# Patient Record
Sex: Male | Born: 1960 | Race: Black or African American | Hispanic: No | Marital: Single | Smoking: Current every day smoker
Health system: Southern US, Community
[De-identification: ages and names within clinical notes are randomized; demographics above are authoritative.]

## PROBLEM LIST (undated history)

## (undated) DIAGNOSIS — I10 Essential (primary) hypertension: Secondary | ICD-10-CM

## (undated) HISTORY — PX: OTHER SURGICAL HISTORY: SHX169

---

## 2019-04-09 DIAGNOSIS — R0600 Dyspnea, unspecified: Secondary | ICD-10-CM

## 2019-04-09 DIAGNOSIS — R0902 Hypoxemia: Secondary | ICD-10-CM

## 2019-04-09 DIAGNOSIS — I10 Essential (primary) hypertension: Secondary | ICD-10-CM

## 2019-04-09 DIAGNOSIS — J441 Chronic obstructive pulmonary disease with (acute) exacerbation: Secondary | ICD-10-CM | POA: Diagnosis not present

## 2019-04-09 DIAGNOSIS — J209 Acute bronchitis, unspecified: Secondary | ICD-10-CM

## 2020-10-28 ENCOUNTER — Inpatient Hospital Stay (HOSPITAL_COMMUNITY)
Admission: EM | Admit: 2020-10-28 | Discharge: 2020-11-01 | DRG: 177 | Disposition: A | Discharge status: Home / Self Care | Payer: Medicaid Other | Attending: Internal Medicine | Admitting: Internal Medicine

## 2020-10-28 ENCOUNTER — Emergency Department (HOSPITAL_COMMUNITY): Payer: Medicaid Other

## 2020-10-28 DIAGNOSIS — G9340 Encephalopathy, unspecified: Secondary | ICD-10-CM

## 2020-10-28 DIAGNOSIS — T380X5A Adverse effect of glucocorticoids and synthetic analogues, initial encounter: Secondary | ICD-10-CM | POA: Diagnosis not present

## 2020-10-28 DIAGNOSIS — N179 Acute kidney failure, unspecified: Secondary | ICD-10-CM | POA: Diagnosis present

## 2020-10-28 DIAGNOSIS — J440 Chronic obstructive pulmonary disease with acute lower respiratory infection: Secondary | ICD-10-CM | POA: Diagnosis present

## 2020-10-28 DIAGNOSIS — J1282 Pneumonia due to coronavirus disease 2019: Secondary | ICD-10-CM | POA: Diagnosis present

## 2020-10-28 DIAGNOSIS — G9341 Metabolic encephalopathy: Secondary | ICD-10-CM | POA: Diagnosis present

## 2020-10-28 DIAGNOSIS — D72829 Elevated white blood cell count, unspecified: Secondary | ICD-10-CM | POA: Diagnosis not present

## 2020-10-28 DIAGNOSIS — E872 Acidosis, unspecified: Secondary | ICD-10-CM

## 2020-10-28 DIAGNOSIS — N400 Enlarged prostate without lower urinary tract symptoms: Secondary | ICD-10-CM | POA: Diagnosis present

## 2020-10-28 DIAGNOSIS — U071 COVID-19: Principal | ICD-10-CM | POA: Diagnosis present

## 2020-10-28 DIAGNOSIS — E860 Dehydration: Secondary | ICD-10-CM | POA: Diagnosis present

## 2020-10-28 DIAGNOSIS — K219 Gastro-esophageal reflux disease without esophagitis: Secondary | ICD-10-CM | POA: Diagnosis present

## 2020-10-28 DIAGNOSIS — J189 Pneumonia, unspecified organism: Secondary | ICD-10-CM | POA: Diagnosis present

## 2020-10-28 DIAGNOSIS — R7401 Elevation of levels of liver transaminase levels: Secondary | ICD-10-CM

## 2020-10-28 DIAGNOSIS — R4182 Altered mental status, unspecified: Secondary | ICD-10-CM

## 2020-10-28 DIAGNOSIS — I10 Essential (primary) hypertension: Secondary | ICD-10-CM | POA: Diagnosis present

## 2020-10-28 HISTORY — DX: Essential (primary) hypertension: I10

## 2020-10-28 MED ORDER — SODIUM CHLORIDE 0.9 % IV BOLUS
1000.0000 mL | Freq: Once | INTRAVENOUS | Status: AC
  Administered 2020-10-29: 1000 mL via INTRAVENOUS

## 2020-10-28 NOTE — ED Notes (Signed)
[phone] pt's baby mama Geanie Kenning has called twice stated that she is the one who called EMS for him, would like to speak to him or someone regarding how he is doing.

## 2020-10-28 NOTE — ED Triage Notes (Signed)
The pt arrived by ems from [employer]   That is where he receives all his medical care  His son  Who is not  With the pt  Reports approx a week of not acting right   He knows the day of the week  Not the month  Does know the year  Moves everything no complaints of pain alert co-operative

## 2020-10-29 ENCOUNTER — Other Ambulatory Visit: Payer: Self-pay

## 2020-10-29 ENCOUNTER — Emergency Department (HOSPITAL_COMMUNITY): Payer: Medicaid Other

## 2020-10-29 ENCOUNTER — Encounter (HOSPITAL_COMMUNITY): Payer: Self-pay | Admitting: *Deleted

## 2020-10-29 DIAGNOSIS — E872 Acidosis, unspecified: Secondary | ICD-10-CM

## 2020-10-29 DIAGNOSIS — N179 Acute kidney failure, unspecified: Secondary | ICD-10-CM

## 2020-10-29 DIAGNOSIS — I10 Essential (primary) hypertension: Secondary | ICD-10-CM

## 2020-10-29 DIAGNOSIS — J189 Pneumonia, unspecified organism: Secondary | ICD-10-CM | POA: Diagnosis not present

## 2020-10-29 DIAGNOSIS — G9340 Encephalopathy, unspecified: Secondary | ICD-10-CM

## 2020-10-29 LAB — URINALYSIS, ROUTINE W REFLEX MICROSCOPIC
Bacteria, UA: NONE SEEN
Bilirubin Urine: NEGATIVE
Glucose, UA: NEGATIVE mg/dL
Ketones, ur: NEGATIVE mg/dL
Leukocytes,Ua: NEGATIVE
Nitrite: NEGATIVE
Protein, ur: 30 mg/dL — AB
Specific Gravity, Urine: 1.014 (ref 1.005–1.030)
pH: 5 (ref 5.0–8.0)

## 2020-10-29 LAB — CBC WITH DIFFERENTIAL/PLATELET
Abs Immature Granulocytes: 0.04 10*3/uL (ref 0.00–0.07)
Basophils Absolute: 0 10*3/uL (ref 0.0–0.1)
Basophils Relative: 0 %
Eosinophils Absolute: 0.1 10*3/uL (ref 0.0–0.5)
Eosinophils Relative: 2 %
HCT: 49.5 % (ref 39.0–52.0)
Hemoglobin: 16.2 g/dL (ref 13.0–17.0)
Immature Granulocytes: 1 %
Lymphocytes Relative: 12 %
Lymphs Abs: 0.7 10*3/uL (ref 0.7–4.0)
MCH: 26.9 pg (ref 26.0–34.0)
MCHC: 32.7 g/dL (ref 30.0–36.0)
MCV: 82.1 fL (ref 80.0–100.0)
Monocytes Absolute: 0.6 10*3/uL (ref 0.1–1.0)
Monocytes Relative: 10 %
Neutro Abs: 4.8 10*3/uL (ref 1.7–7.7)
Neutrophils Relative %: 75 %
Platelets: 366 10*3/uL (ref 150–400)
RBC: 6.03 MIL/uL — ABNORMAL HIGH (ref 4.22–5.81)
RDW: 11.9 % (ref 11.5–15.5)
WBC: 6.3 10*3/uL (ref 4.0–10.5)
nRBC: 0 % (ref 0.0–0.2)

## 2020-10-29 LAB — PROCALCITONIN: Procalcitonin: 0.19 ng/mL

## 2020-10-29 LAB — COMPREHENSIVE METABOLIC PANEL
ALT: 66 U/L — ABNORMAL HIGH (ref 0–44)
AST: 55 U/L — ABNORMAL HIGH (ref 15–41)
Albumin: 2.8 g/dL — ABNORMAL LOW (ref 3.5–5.0)
Alkaline Phosphatase: 64 U/L (ref 38–126)
Anion gap: 16 — ABNORMAL HIGH (ref 5–15)
BUN: 37 mg/dL — ABNORMAL HIGH (ref 8–23)
CO2: 19 mmol/L — ABNORMAL LOW (ref 22–32)
Calcium: 8.4 mg/dL — ABNORMAL LOW (ref 8.9–10.3)
Chloride: 102 mmol/L (ref 98–111)
Creatinine, Ser: 1.49 mg/dL — ABNORMAL HIGH (ref 0.61–1.24)
GFR, Estimated: 53 mL/min — ABNORMAL LOW (ref >60)
Glucose, Bld: 135 mg/dL — ABNORMAL HIGH (ref 70–99)
Potassium: 4.4 mmol/L (ref 3.5–5.1)
Sodium: 137 mmol/L (ref 135–145)
Total Bilirubin: 1.4 mg/dL — ABNORMAL HIGH (ref 0.3–1.2)
Total Protein: 7.3 g/dL (ref 6.5–8.1)

## 2020-10-29 LAB — HIV ANTIBODY (ROUTINE TESTING W REFLEX): HIV Screen 4th Generation wRfx: NONREACTIVE

## 2020-10-29 LAB — VITAMIN B12: Vitamin B-12: 785 pg/mL (ref 180–914)

## 2020-10-29 LAB — BASIC METABOLIC PANEL
Anion gap: 12 (ref 5–15)
BUN: 30 mg/dL — ABNORMAL HIGH (ref 8–23)
CO2: 22 mmol/L (ref 22–32)
Calcium: 8.2 mg/dL — ABNORMAL LOW (ref 8.9–10.3)
Chloride: 105 mmol/L (ref 98–111)
Creatinine, Ser: 1.21 mg/dL (ref 0.61–1.24)
GFR, Estimated: >60 mL/min (ref >60)
Glucose, Bld: 119 mg/dL — ABNORMAL HIGH (ref 70–99)
Potassium: 4.5 mmol/L (ref 3.5–5.1)
Sodium: 139 mmol/L (ref 135–145)

## 2020-10-29 LAB — AMMONIA: Ammonia: 34 umol/L (ref 9–35)

## 2020-10-29 LAB — SARS CORONAVIRUS 2 (TAT 6-24 HRS): SARS Coronavirus 2: POSITIVE — AB

## 2020-10-29 LAB — RESP PANEL BY RT-PCR (FLU A&B, COVID) ARPGX2
Influenza A by PCR: NEGATIVE
Influenza B by PCR: NEGATIVE
SARS Coronavirus 2 by RT PCR: NEGATIVE

## 2020-10-29 LAB — TSH: TSH: 0.989 u[IU]/mL (ref 0.350–4.500)

## 2020-10-29 LAB — LACTIC ACID, PLASMA: Lactic Acid, Venous: 1.3 mmol/L (ref 0.5–1.9)

## 2020-10-29 MED ORDER — HEPARIN SODIUM (PORCINE) 5000 UNIT/ML IJ SOLN
5000.0000 [IU] | Freq: Three times a day (TID) | INTRAMUSCULAR | Status: DC
  Administered 2020-10-29 (×2): 5000 [IU] via SUBCUTANEOUS
  Filled 2020-10-29 (×2): qty 1

## 2020-10-29 MED ORDER — METHYLPREDNISOLONE SODIUM SUCC 125 MG IJ SOLR
0.5000 mg/kg | Freq: Two times a day (BID) | INTRAMUSCULAR | Status: AC
  Administered 2020-10-29 – 2020-11-01 (×6): 41.875 mg via INTRAVENOUS
  Filled 2020-10-29 (×6): qty 2

## 2020-10-29 MED ORDER — GUAIFENESIN-DM 100-10 MG/5ML PO SYRP
10.0000 mL | ORAL_SOLUTION | ORAL | Status: DC | PRN
  Filled 2020-10-29: qty 10

## 2020-10-29 MED ORDER — SODIUM CHLORIDE 0.9 % IV SOLN: INTRAVENOUS | Status: DC

## 2020-10-29 MED ORDER — ZINC SULFATE 220 (50 ZN) MG PO CAPS
220.0000 mg | ORAL_CAPSULE | Freq: Every day | ORAL | Status: DC
  Administered 2020-10-29 – 2020-11-01 (×4): 220 mg via ORAL
  Filled 2020-10-29 (×4): qty 1

## 2020-10-29 MED ORDER — SODIUM CHLORIDE 0.9 % IV SOLN
200.0000 mg | Freq: Once | INTRAVENOUS | Status: AC
  Administered 2020-10-29: 200 mg via INTRAVENOUS
  Filled 2020-10-29: qty 40

## 2020-10-29 MED ORDER — ASCORBIC ACID 500 MG PO TABS
500.0000 mg | ORAL_TABLET | Freq: Every day | ORAL | Status: DC
  Administered 2020-10-29 – 2020-11-01 (×4): 500 mg via ORAL
  Filled 2020-10-29 (×4): qty 1

## 2020-10-29 MED ORDER — AZELASTINE HCL 0.1 % NA SOLN: 1.0000 | Freq: Every day | NASAL | Status: DC | PRN

## 2020-10-29 MED ORDER — SODIUM CHLORIDE 0.9 % IV SOLN
1.0000 g | INTRAVENOUS | Status: DC
  Filled 2020-10-29: qty 10

## 2020-10-29 MED ORDER — HEPARIN SODIUM (PORCINE) 5000 UNIT/ML IJ SOLN
5000.0000 [IU] | Freq: Three times a day (TID) | INTRAMUSCULAR | Status: DC
  Administered 2020-10-29 – 2020-10-30 (×2): 5000 [IU] via SUBCUTANEOUS
  Filled 2020-10-29 (×2): qty 1

## 2020-10-29 MED ORDER — SODIUM CHLORIDE 0.9 % IV SOLN
500.0000 mg | Freq: Once | INTRAVENOUS | Status: AC
  Administered 2020-10-29: 500 mg via INTRAVENOUS
  Filled 2020-10-29: qty 500

## 2020-10-29 MED ORDER — ALBUTEROL SULFATE HFA 108 (90 BASE) MCG/ACT IN AERS
2.0000 | INHALATION_SPRAY | Freq: Four times a day (QID) | RESPIRATORY_TRACT | Status: DC
  Administered 2020-10-29 – 2020-10-30 (×5): 2 via RESPIRATORY_TRACT
  Filled 2020-10-29: qty 6.7

## 2020-10-29 MED ORDER — ACETAMINOPHEN 650 MG RE SUPP: 650.0000 mg | Freq: Four times a day (QID) | RECTAL | Status: DC | PRN

## 2020-10-29 MED ORDER — BUDESON-GLYCOPYRROL-FORMOTEROL 160-9-4.8 MCG/ACT IN AERO: 2.0000 | INHALATION_SPRAY | RESPIRATORY_TRACT | Status: DC

## 2020-10-29 MED ORDER — MOMETASONE FURO-FORMOTEROL FUM 200-5 MCG/ACT IN AERO
2.0000 | INHALATION_SPRAY | Freq: Two times a day (BID) | RESPIRATORY_TRACT | Status: DC
  Administered 2020-10-29 – 2020-11-01 (×7): 2 via RESPIRATORY_TRACT
  Filled 2020-10-29: qty 8.8

## 2020-10-29 MED ORDER — UMECLIDINIUM BROMIDE 62.5 MCG/INH IN AEPB
1.0000 | INHALATION_SPRAY | Freq: Every day | RESPIRATORY_TRACT | Status: DC
  Administered 2020-10-29 – 2020-11-01 (×4): 1 via RESPIRATORY_TRACT
  Filled 2020-10-29: qty 7

## 2020-10-29 MED ORDER — SODIUM CHLORIDE 0.9 % IV SOLN
500.0000 mg | INTRAVENOUS | Status: DC
  Filled 2020-10-29: qty 500

## 2020-10-29 MED ORDER — PREDNISONE 20 MG PO TABS
50.0000 mg | ORAL_TABLET | Freq: Every day | ORAL | Status: DC
  Administered 2020-11-01: 50 mg via ORAL
  Filled 2020-10-29: qty 2

## 2020-10-29 MED ORDER — SODIUM CHLORIDE 0.9 % IV SOLN
100.0000 mg | Freq: Every day | INTRAVENOUS | Status: DC
  Administered 2020-10-30 – 2020-11-01 (×3): 100 mg via INTRAVENOUS
  Filled 2020-10-29 (×3): qty 20

## 2020-10-29 MED ORDER — ENSURE ENLIVE PO LIQD
237.0000 mL | Freq: Two times a day (BID) | ORAL | Status: DC
  Administered 2020-10-29 – 2020-10-31 (×5): 237 mL via ORAL
  Filled 2020-10-29 (×2): qty 237

## 2020-10-29 MED ORDER — MONTELUKAST SODIUM 10 MG PO TABS
10.0000 mg | ORAL_TABLET | Freq: Every day | ORAL | Status: DC
Start: 2020-10-29 — End: 2020-11-01
  Administered 2020-10-29 – 2020-11-01 (×4): 10 mg via ORAL
  Filled 2020-10-29 (×5): qty 1

## 2020-10-29 MED ORDER — TAMSULOSIN HCL 0.4 MG PO CAPS
0.4000 mg | ORAL_CAPSULE | Freq: Every day | ORAL | Status: DC
Start: 2020-10-29 — End: 2020-11-01
  Administered 2020-10-29 – 2020-10-31 (×3): 0.4 mg via ORAL
  Filled 2020-10-29 (×3): qty 1

## 2020-10-29 MED ORDER — PANTOPRAZOLE SODIUM 40 MG PO TBEC
40.0000 mg | DELAYED_RELEASE_TABLET | Freq: Every day | ORAL | Status: DC
  Administered 2020-10-29 – 2020-11-01 (×4): 40 mg via ORAL
  Filled 2020-10-29 (×4): qty 1

## 2020-10-29 MED ORDER — ACETAMINOPHEN 325 MG PO TABS: 650.0000 mg | ORAL_TABLET | Freq: Four times a day (QID) | ORAL | Status: DC | PRN

## 2020-10-29 MED ORDER — SODIUM CHLORIDE 0.9 % IV SOLN
1.0000 g | Freq: Once | INTRAVENOUS | Status: AC
  Administered 2020-10-29: 1 g via INTRAVENOUS
  Filled 2020-10-29: qty 10

## 2020-10-29 NOTE — ED Triage Notes (Signed)
The pt has not had the covid vaccines

## 2020-10-29 NOTE — ED Notes (Signed)
The admitting doctor wants the pt to be reswabbed because she suspects that the pt is actually positive

## 2020-10-29 NOTE — ED Provider Notes (Signed)
[facility] EMERGENCY DEPARTMENT Provider Note   CSN: [id number] Arrival date & time: 10/28/20  2337     History Chief Complaint  Patient presents with  . Altered Mental Status    Cory Leach. is a 60 y.o. male.  The history is provided by the patient and medical records.  Altered Mental Status Presenting symptoms: confusion   Associated symptoms: weakness    60 y.o. M with hx of HTN, presenting to the ED for reported confusion.  Per son, patient has not been acting right for the past week.  Reported to EMS that he seems "off" when talking to him.  Patient himself admits he has not been feeling well, states he feels "foggy" in his head.  He denies any frank confusion.  States he feels weak all over, does not lateralize.  He has not had any falls or syncopal events.  No reported fevers, states he has had some chills.  Denies chest pain or SOB, has had a bit of a cough.  Limited PO intake, states he feels dehydrated.  Past Medical History:  Diagnosis Date  . Hypertension     There are no problems to display for this patient.     No family history on file.     Home Medications Prior to Admission medications   Not on File    Allergies    Patient has no known allergies.  Review of Systems   Review of Systems  Respiratory: Positive for cough.   Neurological: Positive for weakness.  Psychiatric/Behavioral: Positive for confusion.  All other systems reviewed and are negative.   Physical Exam Updated Vital Signs There were no vitals taken for this visit.  Physical Exam Vitals and nursing note reviewed.  Constitutional:      Appearance: He is well-developed and well-nourished.  HENT:     Head: Normocephalic and atraumatic.     Mouth/Throat:     Mouth: Oropharynx is clear and moist.  Eyes:     Extraocular Movements: EOM normal.     Conjunctiva/sclera: Conjunctivae normal.     Pupils: Pupils are equal, round, and reactive to  light.  Cardiovascular:     Rate and Rhythm: Normal rate and regular rhythm.     Heart sounds: Normal heart sounds.  Pulmonary:     Effort: Pulmonary effort is normal.     Breath sounds: Normal breath sounds.     Comments: Mild cough noted on exam, NAD, O2 sats mid 90's on RA Abdominal:     General: Bowel sounds are normal.     Palpations: Abdomen is soft.  Musculoskeletal:        General: Normal range of motion.     Cervical back: Normal range of motion.  Skin:    General: Skin is warm and dry.  Neurological:     Mental Status: He is alert.     Comments: Awake, alert, oriented to self and situation, confusion regarding month/year, able to follow commands but does seem to have some periodic confusion when answering questions, moving arms and legs well  Psychiatric:        Mood and Affect: Mood and affect normal.     ED Results / Procedures / Treatments   Labs (all labs ordered are listed, but only abnormal results are displayed) Labs Reviewed  CBC WITH DIFFERENTIAL/PLATELET - Abnormal; Notable for the following components:      Result Value   RBC 6.03 (*)    All other components  within normal limits  COMPREHENSIVE METABOLIC PANEL - Abnormal; Notable for the following components:   CO2 19 (*)    Glucose, Bld 135 (*)    BUN 37 (*)    Creatinine, Ser 1.49 (*)    Calcium 8.4 (*)    Albumin 2.8 (*)    AST 55 (*)    ALT 66 (*)    Total Bilirubin 1.4 (*)    GFR, Estimated 53 (*)    Anion gap 16 (*)    All other components within normal limits  RESP PANEL BY RT-PCR (FLU A&B, COVID) [id number]  URINALYSIS, ROUTINE W REFLEX MICROSCOPIC    EKG None  Radiology DG Chest Port 1 View  Result Date: 10/29/2020 CLINICAL DATA:  Change in mental status EXAM: PORTABLE CHEST 1 VIEW COMPARISON:  April 18, 2019 FINDINGS: The heart size and mediastinal contours are within normal limits. Multifocal patchy airspace opacities seen predominantly within the periphery of the lower lungs. No pleural  effusion is seen. The visualized skeletal structures are unremarkable. IMPRESSION: Multifocal airspace opacities throughout both lungs predominantly within the lower lungs likely consistent multifocal pneumonia Electronically Signed   By: Jonna Clark M.D.   On: 10/29/2020 00:04    Procedures Procedures (including critical care time)  CRITICAL CARE Performed by: Garlon Hatchet   Total critical care time: 40 minutes  Critical care time was exclusive of separately billable procedures and treating other patients.  Critical care was necessary to treat or prevent imminent or life-threatening deterioration.  Critical care was time spent personally by me on the following activities: development of treatment plan with patient and/or surrogate as well as nursing, discussions with consultants, evaluation of patient's response to treatment, examination of patient, obtaining history from patient or surrogate, ordering and performing treatments and interventions, ordering and review of laboratory studies, ordering and review of radiographic studies, pulse oximetry and re-evaluation of patient's condition.   Medications Ordered in ED Medications  cefTRIAXone (ROCEPHIN) 1 g in sodium chloride 0.9 % 100 mL IVPB (has no administration in time range)  azithromycin (ZITHROMAX) 500 mg in sodium chloride 0.9 % 250 mL IVPB (has no administration in time range)  sodium chloride 0.9 % bolus 1,000 mL (1,000 mLs Intravenous New Bag/Given 10/29/20 0053)    ED Course  I have reviewed the triage vital signs and the nursing notes.  Pertinent labs & imaging results that were available during my care of the patient were reviewed by me and considered in my medical decision making (see chart for details).    MDM Rules/Calculators/A&P    59 year old male here with altered mental status.  Per son has been somewhat "off" for the past week.  Patient himself admits he feels "foggy".  He denies any falls or syncopal  events.  He is awake and alert here, able to answer most questions and follow commands, does seem intermittently confused and having to think quite hard about his answers.  He is unable to give correct month or year.  He is aware of current situation.  He is moving his arms and legs well, no apparent focal deficit.  Will obtain labs, chest x-ray, CT head, COVID screen, urinalysis.  He does appear somewhat clinically dry, will give liter of fluids.  2:11 AM Patient reassessed--- he is holding urinal and seems very confused about what he is trying to do.  He then placed it to his mouth as if to drink from it.  He does seem a bit more confused now  than time of arrival.  Labs reassuring aside from mild AKI and signs of dehydration, covid negative.  Has been given IVF already.  CXR with multifocal pneumonia.  CT head negative.  O2 sats have been low/normal range in low 90's.  No acute signs of distress currently.  Given his apparent confusion, will admit for ongoing care.  Attempted to call son for update x2 but no answer at number left by EMS.  IV abx started.  Discussed with hospitalist, Dr. Loney Loh-- will admit for ongoing care.  Final Clinical Impression(s) / ED Diagnoses Final diagnoses:  Multifocal pneumonia  Altered mental status, unspecified altered mental status type    Rx / DC Orders ED Discharge Orders    None       Garlon Hatchet, PA-C 10/29/20 0249    Marily Memos, MD 10/29/20 [phone]

## 2020-10-29 NOTE — Progress Notes (Signed)
Initial Nutrition Assessment  DOCUMENTATION CODES:   Not applicable  INTERVENTION:  Provide Ensure Enlive po BID, each supplement provides 350 kcal and 20 grams of protein.  Encourage adequate PO intake.   NUTRITION DIAGNOSIS:   Increased nutrient needs related to catabolic illness (COVID) as evidenced by estimated needs.  GOAL:   Patient will meet greater than or equal to 90% of their needs  MONITOR:   PO intake,Supplement acceptance,Skin,Weight trends,Labs,I & O's  REASON FOR ASSESSMENT:   Malnutrition Screening Tool    ASSESSMENT:   60 y.o. male with medical history significant of hypertension, BPH, GERD, COPD presents with altered mental status. Pt COVID positive. Chest x-ray showing multifocal airspace opacities concerning for pneumonia  Pt with acute metabolic encephalopathy, confused. RD unable to obtain pt nutrition history at this time. RD to order nutritional supplements to aid in caloric and protein needs. Unable to complete Nutrition-Focused physical exam at this time. Labs and medications reviewed.   Labs and medications reviewed.   Diet Order:   Diet Order            Diet Heart Room service appropriate? Yes; Fluid consistency: Thin  Diet effective now                 EDUCATION NEEDS:   Not appropriate for education at this time  Skin:  Skin Assessment: Reviewed RN Assessment  Last BM:  1/18  Height:   Ht Readings from Last 1 Encounters:  10/29/20 5\' 10"  (1.778 m)    Weight:   Wt Readings from Last 1 Encounters:  10/29/20 83.6 kg   BMI:  Body mass index is 26.44 kg/m.  Estimated Nutritional Needs:   Kcal:  2100-2300  Protein:  105-115 grams  Fluid:  >/= 2 L/day  10/31/20, MS, RD, LDN RD pager number/after hours weekend pager number on Amion.

## 2020-10-29 NOTE — ED Notes (Addendum)
Patients significant other, Geanie Kenning called to get an update on the patient if possible. [phone]. Patient has called multiple times to speak with the patient.

## 2020-10-29 NOTE — Progress Notes (Signed)
Patient arrived to room 5C15 in NAD, VS stable and patient free from pain. Patient oriented to room and call bell in reach.

## 2020-10-29 NOTE — ED Notes (Signed)
Breakfast ordered 

## 2020-10-29 NOTE — ED Notes (Signed)
Admitting doctor saw

## 2020-10-29 NOTE — ED Notes (Signed)
Report given to the rn on 5c

## 2020-10-29 NOTE — ED Notes (Signed)
The pt is getting multiple calls from outside and the pt appears to be angry  With at least one of them  hes asking for his cell phone

## 2020-10-29 NOTE — Progress Notes (Addendum)
Same day note  Patient seen and examined at bedside.  Patient was admitted to the hospital for altered mental status  At the time of my evaluation, patient complains of mild shortness of breath, feels confused.  Denies any chest pain, sputum production.  No nausea vomiting  Physical examination reveals male slightly confused.  Decreased breath sounds bilaterally  Laboratory data and imaging was reviewed  Assessment and Plan.  Multifocal pneumonia: Chest x-ray with multifocal pneumonia.  COVID test and flu was negative.  No signs of sepsis.  IV Rocephin and Zithromax.  Lactate was within normal limits.  UA was negative.  Procalcitonin pending.  Possible AKI: BUN 37, creatinine 1.4.    No previous labs for comparison.  Possible prerenal in nature.  Diuretic on hold including ACE inhibitor.  Continue IV hydration for now.  Check hydration status in a.m  Acute metabolic encephalopathy: Likely due to pneumonia and AKI/dehydration.  Head CT negative.  Nonfocal exam.  Confused at times.  Unimproved consider MRI of the brain. brain MRI for further evaluation.  Check B12, TSH.  Ammonia within normal limits.  Mild high anion gap metabolic acidosis: Bicarb 19, anion gap 16.  Likely due to AKI. Continue IV hydration.  Check BMP in a.m.   Hypertension:  Blood pressure seems to be stable.  Hold diuretic and lisinopril at this time  BPH  -Continue Flomax  GERD -Continue PPI  COPD:   continue continue inhaler  No Charge  Signed,  Tenny Craw, MD [facility]  Addendum:  10/29/2020 2:45 PM  Rapid covid was negative but PCR positive. With hypoxia, currently on 2 Litres of oxygen and multifocal pneumonia, will start on remdesivir, iv solumedrol.

## 2020-10-29 NOTE — H&P (Addendum)
History and Physical    Renton Berkley OYO:[id number] DOB: [birth year] DOA: 10/28/2020  PCP: Patient, No Pcp Per Patient coming from: Home  Chief Complaint: Altered mental status  HPI: Cory Leach. is a 60 y.o. male with medical history significant of hypertension, BPH, GERD, COPD presenting to the ED via EMS for evaluation of altered mental status.  Family reported confusion and disorientation for the past 1 week.  Patient appears confused.  Reports dry mouth and a poor appetite for the past 1 week.  He has no other complaints.  Denies fevers, chills, body aches, fatigue, cough, shortness of breath, chest pain, nausea, vomiting, abdominal pain, or diarrhea.  He is not vaccinated against COVID.  ED Course: No temperature recorded.  Tachypneic.  Not significantly tachycardic or hypotensive.  WBC 6.3, hemoglobin 16.2, platelet count 366K.  Sodium 137, potassium 4.4, chloride 102, bicarb 19, anion gap 16, BUN 37, creatinine 1.4, glucose 135.  AST 55, ALT 66, alk phos 64, T bili 1.4.  SARS-CoV-2 PCR test and influenza panel both negative.  UA not suggestive of infection.  Chest x-ray showing multifocal airspace opacities concerning for pneumonia.  Head CT negative for acute intracranial abnormality. Patient was given ceftriaxone, azithromycin, and 1 L normal saline bolus.  Review of Systems:  All systems reviewed and apart from history of presenting illness, are negative.  Past Medical History:  Diagnosis Date  . Hypertension    Surgical history: Unable to obtain from the patient at this time given his altered mental status.  Social history: Unable to obtain from the patient at this time given his altered mental status.  Family history: Unable to obtain from the patient at this time given his altered mental status.  No Known Allergies  Prior to Admission medications   Medication Sig Start Date End Date Taking? Authorizing Provider  aspirin 81 MG chewable tablet Chew 81  mg by mouth daily.   Yes [provider]  azelastine (ASTELIN) 0.1 % nasal spray Place 1 spray into both nostrils daily as needed for allergies. 10/05/20  Yes [provider]  BEPREVE 1.5 % SOLN Place 1 drop into both eyes 2 (two) times daily. 09/02/20  Yes [provider]  BREZTRI AEROSPHERE 160-9-4.8 MCG/ACT AERO Inhale 2 puffs into the lungs in the morning and at bedtime. 10/07/20  Yes [provider]  famotidine (PEPCID) 40 MG tablet Take 40 mg by mouth daily. 10/05/20  Yes [provider]  hydrochlorothiazide (HYDRODIURIL) 25 MG tablet Take 25 mg by mouth daily. 10/07/20  Yes [provider]  hydrOXYzine (ATARAX/VISTARIL) 25 MG tablet Take 75 mg by mouth at bedtime. 10/12/20  Yes [provider]  lisinopril (ZESTRIL) 5 MG tablet Take 5 mg by mouth daily. 10/07/20  Yes [provider]  montelukast (SINGULAIR) 10 MG tablet Take 10 mg by mouth daily. 10/05/20  Yes [provider]  Olopatadine HCl 0.2 % SOLN Place 1 drop into both eyes daily as needed. 10/30/17  Yes [provider]  omeprazole (PRILOSEC) 40 MG capsule Take 40 mg by mouth 2 (two) times daily. 10/05/20  Yes [provider]  tamsulosin (FLOMAX) 0.4 MG CAPS capsule Take 0.4 mg by mouth at bedtime. 10/07/20  Yes [provider]    Physical Exam: Vitals:   10/29/20 0415 10/29/20 0430 10/29/20 0530 10/29/20 0536  BP: 131/87 (!) 126/107 (!) 119/91   Pulse: 87 86 87   Resp: 18 (!) 21 (!) 31   Temp:  98.6 F (37 C)  SpO2: 92% 92% 92%     Physical Exam Constitutional:      General: He is not in acute distress. HENT:     Head: Normocephalic and atraumatic.     Mouth/Throat:     Mouth: Mucous membranes are dry.  Eyes:     Extraocular Movements: Extraocular movements intact.     Conjunctiva/sclera: Conjunctivae normal.  Cardiovascular:     Rate and Rhythm: Normal rate and regular rhythm.     Pulses: Normal pulses.   Pulmonary:     Effort: Pulmonary effort is normal. No respiratory distress.     Breath sounds: No wheezing.  Abdominal:     General: Bowel sounds are normal. There is no distension.     Palpations: Abdomen is soft.     Tenderness: There is no abdominal tenderness.  Musculoskeletal:        General: No swelling or tenderness.     Cervical back: Normal range of motion and neck supple.  Skin:    General: Skin is warm and dry.  Neurological:     General: No focal deficit present.     Mental Status: He is alert and oriented to person, place, and time.     Cranial Nerves: No cranial nerve deficit.     Sensory: No sensory deficit.     Motor: No weakness.     Comments: Appears confused     Labs on Admission: I have personally reviewed following labs and imaging studies  CBC: Recent Labs  Lab 10/28/20 2349  WBC 6.3  NEUTROABS 4.8  HGB 16.2  HCT 49.5  MCV 82.1  PLT 638   Basic Metabolic Panel: Recent Labs  Lab 10/28/20 2349  NA 137  K 4.4  CL 102  CO2 19*  GLUCOSE 135*  BUN 37*  CREATININE 1.49*  CALCIUM 8.4*   GFR: CrCl cannot be calculated (Unknown ideal weight.). Liver Function Tests: Recent Labs  Lab 10/28/20 2349  AST 55*  ALT 66*  ALKPHOS 64  BILITOT 1.4*  PROT 7.3  ALBUMIN 2.8*   No results for input(s): LIPASE, AMYLASE in the last 168 hours. No results for input(s): AMMONIA in the last 168 hours. Coagulation Profile: No results for input(s): INR, PROTIME in the last 168 hours. Cardiac Enzymes: No results for input(s): CKTOTAL, CKMB, CKMBINDEX, TROPONINI in the last 168 hours. BNP (last 3 results) No results for input(s): PROBNP in the last 8760 hours. HbA1C: No results for input(s): HGBA1C in the last 72 hours. CBG: No results for input(s): GLUCAP in the last 168 hours. Lipid Profile: No results for input(s): CHOL, HDL, LDLCALC, TRIG, CHOLHDL, LDLDIRECT in the last 72 hours. Thyroid Function Tests: No results for input(s): TSH, T4TOTAL,  FREET4, T3FREE, THYROIDAB in the last 72 hours. Anemia Panel: No results for input(s): VITAMINB12, FOLATE, FERRITIN, TIBC, IRON, RETICCTPCT in the last 72 hours. Urine analysis:    Component Value Date/Time   COLORURINE YELLOW 10/29/2020 0238   APPEARANCEUR CLEAR 10/29/2020 0238   LABSPEC 1.014 10/29/2020 0238   PHURINE 5.0 10/29/2020 0238   GLUCOSEU NEGATIVE 10/29/2020 0238   HGBUR SMALL (A) 10/29/2020 0238   BILIRUBINUR NEGATIVE 10/29/2020 0238   KETONESUR NEGATIVE 10/29/2020 0238   PROTEINUR 30 (A) 10/29/2020 0238   NITRITE NEGATIVE 10/29/2020 0238   LEUKOCYTESUR NEGATIVE 10/29/2020 0238    Radiological Exams on Admission: CT Head Wo Contrast  Result Date: 10/29/2020 CLINICAL DATA:  Delirium, history of hypertension EXAM: CT HEAD WITHOUT CONTRAST TECHNIQUE:  Contiguous axial images were obtained from the base of the skull through the vertex without intravenous contrast. COMPARISON:  None. FINDINGS: Brain: No evidence of acute infarction, hemorrhage, hydrocephalus, extra-axial collection, visible mass lesion or mass effect. Symmetric prominence of the ventricles, cisterns and sulci compatible with parenchymal volume loss. Patchy areas of white matter hypoattenuation are most compatible with chronic microvascular angiopathy. Vascular: Atherosclerotic calcification of the level of the skull base. No concerning hyperdense vessel. Skull: No significant scalp swelling or hematoma. No calvarial fracture conspicuous osseous lesions. Likely remote deformity of the right nasal bone. Sinuses/Orbits: Minimal thickening in the ethmoids, otherwise the paranasal sinuses and mastoid air cells are predominantly clear. Middle ear cavities are clear. Included orbital structures are unremarkable. Other: None IMPRESSION: 1. No acute intracranial findings. 2. Mild parenchymal volume loss and chronic microvascular angiopathy. Electronically Signed   By: Lovena Le M.D.   On: 10/29/2020 01:44   DG Chest Port 1  View  Result Date: 10/29/2020 CLINICAL DATA:  Change in mental status EXAM: PORTABLE CHEST 1 VIEW COMPARISON:  April 18, 2019 FINDINGS: The heart size and mediastinal contours are within normal limits. Multifocal patchy airspace opacities seen predominantly within the periphery of the lower lungs. No pleural effusion is seen. The visualized skeletal structures are unremarkable. IMPRESSION: Multifocal airspace opacities throughout both lungs predominantly within the lower lungs likely consistent multifocal pneumonia Electronically Signed   By: Prudencio Pair M.D.   On: 10/29/2020 00:04    EKG: Independently reviewed.  Sinus rhythm, RBBB, LAFB.  No prior tracing for comparison.  Assessment/Plan Principal Problem:   Pneumonia Active Problems:   AKI (acute kidney injury) ([place])   Encephalopathy   Metabolic acidosis   HTN (hypertension)   Multifocal pneumonia: Chest x-ray showing multifocal airspace opacities predominantly in the lower lungs.  SARS-CoV-2 PCR test and influenza panel both negative.  No leukocytosis on labs.  No signs of sepsis at this time.  Not hypoxic at rest. -Continue ceftriaxone and azithromycin.  Check lactic acid level.  Check procalcitonin level.  No blood cultures drawn in the ED prior to initiation of antibiotics, will order now.  Since the patient is not vaccinated against COVID, will continue isolation precautions at this time and repeat SARS-CoV-2 PCR test.  Possible AKI: BUN 37, creatinine 1.4.  No prior labs for comparison.  Possibly prerenal due to dehydration and home thiazide diuretic/ACE inhibitor use. -Patient received fluid bolus in the ED.  Continue IV fluid hydration.  Monitor renal function and urine output.  Avoid nephrotoxic agents.  Hold home hydrochlorothiazide and lisinopril.  Acute metabolic encephalopathy: Likely due to pneumonia and AKI/dehydration.  Head CT negative for acute intracranial abnormality.  Neuro exam is nonfocal. No meningeal signs.  Patient  is currently AAO x3 but appears confused. -If no improvement with treatment plan mentioned above, will need brain MRI for further evaluation.  Check B12, TSH, and ammonia levels.  Mild high anion gap metabolic acidosis: Bicarb 19, anion gap 16.  Likely due to AKI. -Check lactic acid level.  IV fluid hydration, repeat metabolic panel.  Hypertension: Stable. -Hold home diuretic and ARB given AKI  BPH -Continue Flomax  GERD -Continue PPI  COPD: Stable.  No signs of acute exacerbation at this time. -Continue home inhaler  DVT prophylaxis: Subcutaneous heparin Code Status: Full code Family Communication: No family available at this time. Disposition Plan: Status is: Observation  The patient remains OBS appropriate and will d/c before 2 midnights.  Dispo: The patient is from: Home  Anticipated d/c is to: Home              Anticipated d/c date is: 2 days              Patient currently is not medically stable to d/c.  The medical decision making on this patient was of high complexity and the patient is at high risk for clinical deterioration, therefore this is a level 3 visit.  Shela Leff MD [facility]  If 7PM-7AM, please contact night-coverage [email or web address]  10/29/2020, 7:02 AM

## 2020-10-30 ENCOUNTER — Observation Stay (HOSPITAL_COMMUNITY): Payer: Medicaid Other

## 2020-10-30 DIAGNOSIS — N179 Acute kidney failure, unspecified: Secondary | ICD-10-CM | POA: Diagnosis present

## 2020-10-30 DIAGNOSIS — G9340 Encephalopathy, unspecified: Secondary | ICD-10-CM | POA: Diagnosis not present

## 2020-10-30 DIAGNOSIS — K219 Gastro-esophageal reflux disease without esophagitis: Secondary | ICD-10-CM | POA: Diagnosis present

## 2020-10-30 DIAGNOSIS — E860 Dehydration: Secondary | ICD-10-CM | POA: Diagnosis present

## 2020-10-30 DIAGNOSIS — U071 COVID-19: Secondary | ICD-10-CM | POA: Diagnosis present

## 2020-10-30 DIAGNOSIS — J189 Pneumonia, unspecified organism: Secondary | ICD-10-CM | POA: Diagnosis present

## 2020-10-30 DIAGNOSIS — I10 Essential (primary) hypertension: Secondary | ICD-10-CM | POA: Diagnosis present

## 2020-10-30 DIAGNOSIS — T380X5A Adverse effect of glucocorticoids and synthetic analogues, initial encounter: Secondary | ICD-10-CM | POA: Diagnosis not present

## 2020-10-30 DIAGNOSIS — J1282 Pneumonia due to coronavirus disease 2019: Secondary | ICD-10-CM | POA: Diagnosis present

## 2020-10-30 DIAGNOSIS — E872 Acidosis: Secondary | ICD-10-CM | POA: Diagnosis present

## 2020-10-30 DIAGNOSIS — J069 Acute upper respiratory infection, unspecified: Secondary | ICD-10-CM

## 2020-10-30 DIAGNOSIS — D72829 Elevated white blood cell count, unspecified: Secondary | ICD-10-CM | POA: Diagnosis not present

## 2020-10-30 DIAGNOSIS — J440 Chronic obstructive pulmonary disease with acute lower respiratory infection: Secondary | ICD-10-CM | POA: Diagnosis present

## 2020-10-30 DIAGNOSIS — G9341 Metabolic encephalopathy: Secondary | ICD-10-CM | POA: Diagnosis present

## 2020-10-30 DIAGNOSIS — N400 Enlarged prostate without lower urinary tract symptoms: Secondary | ICD-10-CM | POA: Diagnosis present

## 2020-10-30 LAB — COMPREHENSIVE METABOLIC PANEL
ALT: 70 U/L — ABNORMAL HIGH (ref 0–44)
AST: 59 U/L — ABNORMAL HIGH (ref 15–41)
Albumin: 2.7 g/dL — ABNORMAL LOW (ref 3.5–5.0)
Alkaline Phosphatase: 67 U/L (ref 38–126)
Anion gap: 12 (ref 5–15)
BUN: 28 mg/dL — ABNORMAL HIGH (ref 8–23)
CO2: 20 mmol/L — ABNORMAL LOW (ref 22–32)
Calcium: 8.5 mg/dL — ABNORMAL LOW (ref 8.9–10.3)
Chloride: 108 mmol/L (ref 98–111)
Creatinine, Ser: 1.22 mg/dL (ref 0.61–1.24)
GFR, Estimated: >60 mL/min (ref >60)
Glucose, Bld: 143 mg/dL — ABNORMAL HIGH (ref 70–99)
Potassium: 4.3 mmol/L (ref 3.5–5.1)
Sodium: 140 mmol/L (ref 135–145)
Total Bilirubin: 0.8 mg/dL (ref 0.3–1.2)
Total Protein: 7.2 g/dL (ref 6.5–8.1)

## 2020-10-30 LAB — CBC WITH DIFFERENTIAL/PLATELET
Abs Immature Granulocytes: 0.03 10*3/uL (ref 0.00–0.07)
Basophils Absolute: 0 10*3/uL (ref 0.0–0.1)
Basophils Relative: 0 %
Eosinophils Absolute: 0 10*3/uL (ref 0.0–0.5)
Eosinophils Relative: 0 %
HCT: 47.3 % (ref 39.0–52.0)
Hemoglobin: 15.3 g/dL (ref 13.0–17.0)
Immature Granulocytes: 1 %
Lymphocytes Relative: 14 %
Lymphs Abs: 0.8 10*3/uL (ref 0.7–4.0)
MCH: 26.5 pg (ref 26.0–34.0)
MCHC: 32.3 g/dL (ref 30.0–36.0)
MCV: 81.8 fL (ref 80.0–100.0)
Monocytes Absolute: 0.3 10*3/uL (ref 0.1–1.0)
Monocytes Relative: 6 %
Neutro Abs: 4.2 10*3/uL (ref 1.7–7.7)
Neutrophils Relative %: 79 %
Platelets: 405 10*3/uL — ABNORMAL HIGH (ref 150–400)
RBC: 5.78 MIL/uL (ref 4.22–5.81)
RDW: 11.9 % (ref 11.5–15.5)
WBC: 5.3 10*3/uL (ref 4.0–10.5)
nRBC: 0 % (ref 0.0–0.2)

## 2020-10-30 LAB — FERRITIN: Ferritin: 913 ng/mL — ABNORMAL HIGH (ref 24–336)

## 2020-10-30 LAB — D-DIMER, QUANTITATIVE: D-Dimer, Quant: 2.92 ug/mL-FEU — ABNORMAL HIGH (ref 0.00–0.50)

## 2020-10-30 LAB — MAGNESIUM: Magnesium: 2.5 mg/dL — ABNORMAL HIGH (ref 1.7–2.4)

## 2020-10-30 LAB — C-REACTIVE PROTEIN: CRP: 10.8 mg/dL — ABNORMAL HIGH (ref <1.0)

## 2020-10-30 MED ORDER — ALBUTEROL SULFATE HFA 108 (90 BASE) MCG/ACT IN AERS
2.0000 | INHALATION_SPRAY | Freq: Four times a day (QID) | RESPIRATORY_TRACT | Status: DC | PRN
  Administered 2020-10-31 – 2020-11-01 (×2): 2 via RESPIRATORY_TRACT

## 2020-10-30 MED ORDER — ENOXAPARIN SODIUM 80 MG/0.8ML ~~LOC~~ SOLN
80.0000 mg | Freq: Two times a day (BID) | SUBCUTANEOUS | Status: DC
  Administered 2020-10-30 – 2020-11-01 (×5): 80 mg via SUBCUTANEOUS
  Filled 2020-10-30 (×5): qty 0.8

## 2020-10-30 MED ORDER — HYDROXYZINE HCL 25 MG PO TABS
75.0000 mg | ORAL_TABLET | Freq: Every day | ORAL | Status: DC
  Administered 2020-10-30 – 2020-10-31 (×2): 75 mg via ORAL
  Filled 2020-10-30 (×2): qty 3

## 2020-10-30 MED ORDER — BARICITINIB 2 MG PO TABS
4.0000 mg | ORAL_TABLET | Freq: Every day | ORAL | Status: DC
  Administered 2020-10-30 – 2020-11-01 (×3): 4 mg via ORAL
  Filled 2020-10-30 (×3): qty 2

## 2020-10-30 NOTE — Progress Notes (Signed)
PROGRESS NOTE    Remberto Lienhard  LFY:[id number] DOB: Apr 25, 1961 DOA: 10/28/2020 PCP: Patient, No Pcp Per   Brief Narrative:  60 year old with history of HTN, BPH, GERD, COPD admitted for altered mental status 1 week prior to admission.  Patient is not vaccinated found to have COVID-positive.  CT head negative.  Admission work-up showed evidence of clinical dehydration, mild AKI and chest x-ray with bilateral opacities   Assessment & Plan:   Principal Problem:   Pneumonia Active Problems:   AKI (acute kidney injury) (HCC)   Encephalopathy   Metabolic acidosis   HTN (hypertension)  Acute respiratory distress secondary to COVID-19 pneumonia -Oxygen levels- 2 L nasal cannula saturating 91% -Remdesivir- day 2 -Solumedrol- day 2 -Baricitinib ordered, day 1 -Antibiotics- IV Rocephin and azithromycin, will discontinue -procalcitonin- procalcitonin 0.1 -Chest x-ray- bilateral/multifocal infiltrates -Supportive care-antitussive, inhalers, I-S/flutter -CODE STATUS confirmed -Prone >16hrs/day.  -Routine: Labs have been reviewed including ferritin, LDH, CRP, d-dimer, fibrinogen.  Will need to trend this lab daily.  Acute kidney injury; likely prenal from dehydration - Unknown baseline creatinine.  Admission creatinine 1.4  Acute metabolic encephalopathy, resolved - CT head negative. - TSH, ammonia and B12- Normal  Elevated anion gap with metabolic acidosis likely from dehydration - Resolved with fluids  Essential hypertension - Home blood pressure medications on hold  BPH - Flomax  GERD - PPI  History of COPD - Home bronchodilators   DVT prophylaxis: Full dose Lovenox due to elevated D-dimer Code Status: Full code Family Communication: Updated his older brother    Dispo: The patient is from: Home              Anticipated d/c is to: Home              Anticipated d/c date is: 2 days              Patient currently is not medically stable to d/c.  Patient is  hypoxic requiring 2 L of nasal cannula with bilateral infiltrates and dyspnea with minimal exertion.  Not safe for discharge   Body mass index is 26.44 kg/m.    \ Subjective: On room air patient desaturates down to 86% requiring 2 L of nasal cannula.  Has dyspnea on exertion Denies any signs of bleeding  Review of Systems Otherwise negative except as per HPI, including: General: Denies fever, chills, night sweats or unintended weight loss. Resp: Denies hemoptysis Cardiac: Denies chest pain, palpitations, orthopnea, paroxysmal nocturnal dyspnea. GI: Denies abdominal pain, nausea, vomiting, diarrhea or constipation GU: Denies dysuria, frequency, hesitancy or incontinence MS: Denies muscle aches, joint pain or swelling Neuro: Denies headache, neurologic deficits (focal weakness, numbness, tingling), abnormal gait Psych: Denies anxiety, depression, SI/HI/AVH Skin: Denies new rashes or lesions ID: Denies sick contacts, exotic exposures, travel  Examination:  Constitutional: Not in acute distress Respiratory: Bilateral rhonchi Cardiovascular: Normal sinus rhythm, no rubs Abdomen: Nontender nondistended good bowel sounds Musculoskeletal: No edema noted Skin: No rashes seen Neurologic: CN 2-12 grossly intact.  And nonfocal Psychiatric: Normal judgment and insight. Alert and oriented x 3. Normal mood.  Objective: Vitals:   10/29/20 2056 10/29/20 2134 10/30/20 0323 10/30/20 0700  BP:  120/87  (!) 131/94  Pulse:  82  78  Resp:  16  17  Temp:  97.7 F (36.5 C)  98.3 F (36.8 C)  TempSrc:  Oral  Oral  SpO2: 96% 95% 93% 91%  Weight:      Height:  Intake/Output Summary (Last 24 hours) at 10/30/2020 0803 Last data filed at 10/30/2020 0400 Gross per 24 hour  Intake 1306.22 ml  Output 400 ml  Net 906.22 ml   Filed Weights   10/29/20 0857  Weight: 83.6 kg     Data Reviewed:   CBC: Recent Labs  Lab 10/28/20 2349 10/30/20 0650  WBC 6.3 5.3  NEUTROABS 4.8 4.2   HGB 16.2 15.3  HCT 49.5 47.3  MCV 82.1 81.8  PLT 366 405*   Basic Metabolic Panel: Recent Labs  Lab 10/28/20 2349 10/29/20 0854  NA 137 139  K 4.4 4.5  CL 102 105  CO2 19* 22  GLUCOSE 135* 119*  BUN 37* 30*  CREATININE 1.49* 1.21  CALCIUM 8.4* 8.2*   GFR: Estimated Creatinine Clearance: 66.2 mL/min (by C-G formula based on SCr of 1.21 mg/dL). Liver Function Tests: Recent Labs  Lab 10/28/20 2349  AST 55*  ALT 66*  ALKPHOS 64  BILITOT 1.4*  PROT 7.3  ALBUMIN 2.8*   No results for input(s): LIPASE, AMYLASE in the last 168 hours. Recent Labs  Lab 10/29/20 0854  AMMONIA 34   Coagulation Profile: No results for input(s): INR, PROTIME in the last 168 hours. Cardiac Enzymes: No results for input(s): CKTOTAL, CKMB, CKMBINDEX, TROPONINI in the last 168 hours. BNP (last 3 results) No results for input(s): PROBNP in the last 8760 hours. HbA1C: No results for input(s): HGBA1C in the last 72 hours. CBG: No results for input(s): GLUCAP in the last 168 hours. Lipid Profile: No results for input(s): CHOL, HDL, LDLCALC, TRIG, CHOLHDL, LDLDIRECT in the last 72 hours. Thyroid Function Tests: Recent Labs    10/29/20 0854  TSH 0.989   Anemia Panel: Recent Labs    10/29/20 0854  VITAMINB12 785   Sepsis Labs: Recent Labs  Lab 10/29/20 0854  PROCALCITON 0.19  LATICACIDVEN 1.3    Recent Results (from the past 240 hour(s))  Resp Panel by RT-PCR (Flu A&B, Covid) Nasopharyngeal Swab     Status: None   Collection Time: 10/29/20  1:01 AM   Specimen: Nasopharyngeal Swab; Nasopharyngeal(NP) swabs in vial transport medium  Result Value Ref Range Status   SARS Coronavirus 2 by RT PCR NEGATIVE NEGATIVE Final    Comment: (NOTE) SARS-CoV-2 target nucleic acids are NOT DETECTED.  The SARS-CoV-2 RNA is generally detectable in upper respiratory specimens during the acute phase of infection. The lowest concentration of SARS-CoV-2 viral copies this assay can detect is 138  copies/mL. A negative result does not preclude SARS-Cov-2 infection and should not be used as the sole basis for treatment or other patient management decisions. A negative result may occur with  improper specimen collection/handling, submission of specimen other than nasopharyngeal swab, presence of viral mutation(s) within the areas targeted by this assay, and inadequate number of viral copies(<138 copies/mL). A negative result must be combined with clinical observations, patient history, and epidemiological information. The expected result is Negative.  Fact Sheet for Patients:  BloggerCourse.com  Fact Sheet for Healthcare Providers:  SeriousBroker.it  This test is no t yet approved or cleared by the [place] FDA and  has been authorized for detection and/or diagnosis of SARS-CoV-2 by FDA under an Emergency Use Authorization (EUA). This EUA will remain  in effect (meaning this test can be used) for the duration of the COVID-19 declaration under Section 564(b)(1) of the Act, 21 U.S.C.section 360bbb-3(b)(1), unless the authorization is terminated  or revoked sooner.       Influenza A  by PCR NEGATIVE NEGATIVE Final   Influenza B by PCR NEGATIVE NEGATIVE Final    Comment: (NOTE) The Xpert Xpress SARS-CoV-2/FLU/RSV plus assay is intended as an aid in the diagnosis of influenza from Nasopharyngeal swab specimens and should not be used as a sole basis for treatment. Nasal washings and aspirates are unacceptable for Xpert Xpress SARS-CoV-2/FLU/RSV testing.  Fact Sheet for Patients: BloggerCourse.comhttps://www.fda.gov/media/152166/download  Fact Sheet for Healthcare Providers: SeriousBroker.ithttps://www.fda.gov/media/152162/download  This test is not yet approved or cleared by the [place] FDA and has been authorized for detection and/or diagnosis of SARS-CoV-2 by FDA under an Emergency Use Authorization (EUA). This EUA will remain in effect (meaning  this test can be used) for the duration of the COVID-19 declaration under Section 564(b)(1) of the Act, 21 U.S.C. section 360bbb-3(b)(1), unless the authorization is terminated or revoked.  Performed at [facility], [address] [place], [place] [address]   SARS CORONAVIRUS 2 (TAT 6-24 HRS) Nasopharyngeal Nasopharyngeal Swab     Status: Abnormal   Collection Time: 10/29/20  6:20 AM   Specimen: Nasopharyngeal Swab  Result Value Ref Range Status   SARS Coronavirus 2 POSITIVE (A) NEGATIVE Final    Comment: (NOTE) SARS-CoV-2 target nucleic acids are DETECTED.  The SARS-CoV-2 RNA is generally detectable in upper and lower respiratory specimens during the acute phase of infection. Positive results are indicative of the presence of SARS-CoV-2 RNA. Clinical correlation with patient history and other diagnostic information is  necessary to determine patient infection status. Positive results do not rule out bacterial infection or co-infection with other viruses.  The expected result is Negative.  Fact Sheet for Patients: HairSlick.nohttps://www.fda.gov/media/138098/download  Fact Sheet for Healthcare Providers: quierodirigir.comhttps://www.fda.gov/media/138095/download  This test is not yet approved or cleared by the [place] FDA and  has been authorized for detection and/or diagnosis of SARS-CoV-2 by FDA under an Emergency Use Authorization (EUA). This EUA will remain  in effect (meaning this test can be used) for the duration of the COVID-19 declaration under Section 564(b)(1) of the Act, 21 U. S.C. section 360bbb-3(b)(1), unless the authorization is terminated or revoked sooner.   Performed at [facility], [address] [place], [place] [address]          Radiology Studies: CT Head Wo Contrast  Result Date: 10/29/2020 CLINICAL DATA:  Delirium, history of hypertension EXAM: CT HEAD WITHOUT CONTRAST TECHNIQUE: Contiguous axial images were obtained from the base of the skull  through the vertex without intravenous contrast. COMPARISON:  None. FINDINGS: Brain: No evidence of acute infarction, hemorrhage, hydrocephalus, extra-axial collection, visible mass lesion or mass effect. Symmetric prominence of the ventricles, cisterns and sulci compatible with parenchymal volume loss. Patchy areas of white matter hypoattenuation are most compatible with chronic microvascular angiopathy. Vascular: Atherosclerotic calcification of the level of the skull base. No concerning hyperdense vessel. Skull: No significant scalp swelling or hematoma. No calvarial fracture conspicuous osseous lesions. Likely remote deformity of the right nasal bone. Sinuses/Orbits: Minimal thickening in the ethmoids, otherwise the paranasal sinuses and mastoid air cells are predominantly clear. Middle ear cavities are clear. Included orbital structures are unremarkable. Other: None IMPRESSION: 1. No acute intracranial findings. 2. Mild parenchymal volume loss and chronic microvascular angiopathy. Electronically Signed   By: Kreg ShropshirePrice  DeHay M.D.   On: 10/29/2020 01:44   DG Chest Port 1 View  Result Date: 10/29/2020 CLINICAL DATA:  Change in mental status EXAM: PORTABLE CHEST 1 VIEW COMPARISON:  April 18, 2019 FINDINGS: The heart size and  mediastinal contours are within normal limits. Multifocal patchy airspace opacities seen predominantly within the periphery of the lower lungs. No pleural effusion is seen. The visualized skeletal structures are unremarkable. IMPRESSION: Multifocal airspace opacities throughout both lungs predominantly within the lower lungs likely consistent multifocal pneumonia Electronically Signed   By: Jonna Clark M.D.   On: 10/29/2020 00:04        Scheduled Meds:  albuterol  2 puff Inhalation Q6H   vitamin C  500 mg Oral Daily   feeding supplement  237 mL Oral BID BM   heparin  5,000 Units Subcutaneous Q8H   methylPREDNISolone (SOLU-MEDROL) injection  0.5 mg/kg Intravenous Q12H    Followed by   Melene Muller ON 11/01/2020] predniSONE  50 mg Oral Daily   mometasone-formoterol  2 puff Inhalation BID   And   umeclidinium bromide  1 puff Inhalation Daily   montelukast  10 mg Oral Daily   pantoprazole  40 mg Oral Daily   tamsulosin  0.4 mg Oral QHS   zinc sulfate  220 mg Oral Daily   Continuous Infusions:  remdesivir 100 mg in NS 100 mL       LOS: 0 days   Time spent= 35 mins    Brynnly Bonet Joline Maxcy, MD [facility]  If 7PM-7AM, please contact night-coverage  10/30/2020, 8:03 AM

## 2020-10-30 NOTE — Progress Notes (Addendum)
Pt is positive for COVID + on O2 + ddimer. D/w Dr. Reesa Chew, we will change his SQ heparin dose to full dose Lovenox based on the new NIH COVID guideline.  Onnie Boer, PharmD, BCIDP, AAHIVP, CPP Infectious Disease Pharmacist 10/30/2020 9:52 AM   Adding baricitinib 50m PO qday x14. eGFR>60  MOnnie Boer PharmD, BCIDP, AAHIVP, CPP Infectious Disease Pharmacist 10/30/2020 11:38 AM

## 2020-10-31 DIAGNOSIS — E872 Acidosis: Secondary | ICD-10-CM | POA: Diagnosis not present

## 2020-10-31 DIAGNOSIS — I10 Essential (primary) hypertension: Secondary | ICD-10-CM | POA: Diagnosis not present

## 2020-10-31 DIAGNOSIS — N179 Acute kidney failure, unspecified: Secondary | ICD-10-CM | POA: Diagnosis not present

## 2020-10-31 DIAGNOSIS — J189 Pneumonia, unspecified organism: Secondary | ICD-10-CM | POA: Diagnosis not present

## 2020-10-31 LAB — COMPREHENSIVE METABOLIC PANEL
ALT: 84 U/L — ABNORMAL HIGH (ref 0–44)
AST: 61 U/L — ABNORMAL HIGH (ref 15–41)
Albumin: 2.6 g/dL — ABNORMAL LOW (ref 3.5–5.0)
Alkaline Phosphatase: 60 U/L (ref 38–126)
Anion gap: 10 (ref 5–15)
BUN: 27 mg/dL — ABNORMAL HIGH (ref 8–23)
CO2: 20 mmol/L — ABNORMAL LOW (ref 22–32)
Calcium: 8.7 mg/dL — ABNORMAL LOW (ref 8.9–10.3)
Chloride: 109 mmol/L (ref 98–111)
Creatinine, Ser: 1.19 mg/dL (ref 0.61–1.24)
GFR, Estimated: >60 mL/min (ref >60)
Glucose, Bld: 139 mg/dL — ABNORMAL HIGH (ref 70–99)
Potassium: 4.6 mmol/L (ref 3.5–5.1)
Sodium: 139 mmol/L (ref 135–145)
Total Bilirubin: 0.8 mg/dL (ref 0.3–1.2)
Total Protein: 6.8 g/dL (ref 6.5–8.1)

## 2020-10-31 LAB — CBC WITH DIFFERENTIAL/PLATELET
Abs Immature Granulocytes: 0.08 10*3/uL — ABNORMAL HIGH (ref 0.00–0.07)
Basophils Absolute: 0 10*3/uL (ref 0.0–0.1)
Basophils Relative: 0 %
Eosinophils Absolute: 0 10*3/uL (ref 0.0–0.5)
Eosinophils Relative: 0 %
HCT: 42.3 % (ref 39.0–52.0)
Hemoglobin: 14.7 g/dL (ref 13.0–17.0)
Immature Granulocytes: 1 %
Lymphocytes Relative: 10 %
Lymphs Abs: 1.1 10*3/uL (ref 0.7–4.0)
MCH: 27.9 pg (ref 26.0–34.0)
MCHC: 34.8 g/dL (ref 30.0–36.0)
MCV: 80.4 fL (ref 80.0–100.0)
Monocytes Absolute: 0.6 10*3/uL (ref 0.1–1.0)
Monocytes Relative: 5 %
Neutro Abs: 9.8 10*3/uL — ABNORMAL HIGH (ref 1.7–7.7)
Neutrophils Relative %: 84 %
Platelets: 374 10*3/uL (ref 150–400)
RBC: 5.26 MIL/uL (ref 4.22–5.81)
RDW: 11.8 % (ref 11.5–15.5)
WBC: 11.7 10*3/uL — ABNORMAL HIGH (ref 4.0–10.5)
nRBC: 0 % (ref 0.0–0.2)

## 2020-10-31 LAB — C-REACTIVE PROTEIN: CRP: 5 mg/dL — ABNORMAL HIGH (ref <1.0)

## 2020-10-31 LAB — MAGNESIUM: Magnesium: 2.4 mg/dL (ref 1.7–2.4)

## 2020-10-31 LAB — D-DIMER, QUANTITATIVE: D-Dimer, Quant: 1.78 ug/mL-FEU — ABNORMAL HIGH (ref 0.00–0.50)

## 2020-10-31 LAB — FERRITIN: Ferritin: 836 ng/mL — ABNORMAL HIGH (ref 24–336)

## 2020-10-31 NOTE — Progress Notes (Signed)
PROGRESS NOTE    Cory Leach  WUJ:[id number] DOB: May 21, 1961 DOA: 10/28/2020 PCP: Patient, No Pcp Per   Brief Narrative:  60 year old with history of HTN, BPH, GERD, COPD admitted for altered mental status 1 week prior to admission.  Patient is not vaccinated found to have COVID-positive.  CT head negative.  Admission work-up showed evidence of clinical dehydration, mild AKI and chest x-ray with bilateral opacities.  Started on remdesivir, Solu-Medrol and baricitinib.   Assessment & Plan:   Principal Problem:   Pneumonia Active Problems:   AKI (acute kidney injury) (HCC)   Encephalopathy   Metabolic acidosis   HTN (hypertension)  Acute respiratory distress secondary to COVID-19 pneumonia -Oxygen levels- respiratory distress with ambulation.  Currently on 2 L nasal cannula -Remdesivir- day 3 -Solumedrol- day 3 -Baricitinib -day 2 -Antibiotics- IV Rocephin and azithromycin, will discontinue -procalcitonin- procalcitonin 0.1 -Chest x-ray- bilateral/multifocal infiltrates -Supportive care-antitussive, inhalers, I-S/flutter -CODE STATUS confirmed -Prone >16hrs/day.  -Routine: Labs have been reviewed including ferritin, LDH, CRP, d-dimer, fibrinogen.  Will need to trend this lab daily.  Acute kidney injury; likely prenal from dehydration - Unknown baseline creatinine.  Admission creatinine 1.4. Current Cr 1.19  Acute metabolic encephalopathy, resolved - CT head negative. - TSH, ammonia and B12- Normal  Elevated anion gap with metabolic acidosis likely from dehydration - Resolved with fluids  Essential hypertension - Home blood pressure medications on hold  BPH - Flomax  GERD - PPI  History of COPD - Home bronchodilators   DVT prophylaxis: Full dose Lovenox due to elevated D-dimer Code Status: Full code Family Communication: Updated his older brother    Dispo: The patient is from: Home              Anticipated d/c is to: Home               Anticipated d/c date is: 1-2 days              Patient currently is not medically stable to d/c.  Dyspnea on exertion requiring 2 L nasal cannula.  If we are able to wean him off over the next 24 hours, will plan to send him home tomorrow  Body mass index is 26.44 kg/m.    \ Subjective: Feels better no complaints this morning.  Still having some dyspnea with moderate exertion.  Review of Systems Otherwise negative except as per HPI, including: General: Denies fever, chills, night sweats or unintended weight loss. Resp: Denies cough, wheezing, shortness of breath. Cardiac: Denies chest pain, palpitations, orthopnea, paroxysmal nocturnal dyspnea. GI: Denies abdominal pain, nausea, vomiting, diarrhea or constipation GU: Denies dysuria, frequency, hesitancy or incontinence MS: Denies muscle aches, joint pain or swelling Neuro: Denies headache, neurologic deficits (focal weakness, numbness, tingling), abnormal gait Psych: Denies anxiety, depression, SI/HI/AVH Skin: Denies new rashes or lesions ID: Denies sick contacts, exotic exposures, travel Examination: Constitutional: Not in acute distress Respiratory: Clear to auscultation bilaterally Cardiovascular: Normal sinus rhythm, no rubs Abdomen: Nontender nondistended good bowel sounds Musculoskeletal: No edema noted Skin: No rashes seen Neurologic: CN 2-12 grossly intact.  And nonfocal Psychiatric: Normal judgment and insight. Alert and oriented x 3. Normal mood.   Objective: Vitals:   10/30/20 1225 10/30/20 1832 10/30/20 2219 10/31/20 0030  BP: (!) 148/97 (!) 141/92  (!) 153/101  Pulse: 89 80  80  Resp: 16 16  17   Temp: 97.6 F (36.4 C) 97.6 F (36.4 C)  97.7 F (36.5 C)  TempSrc: Oral Oral  Oral  SpO2: 95% 92% 94% 94%  Weight:      Height:        Intake/Output Summary (Last 24 hours) at 10/31/2020 0739 Last data filed at 10/31/2020 0600 Gross per 24 hour  Intake 820 ml  Output 400 ml  Net 420 ml   Filed Weights    10/29/20 0857  Weight: 83.6 kg     Data Reviewed:   CBC: Recent Labs  Lab 10/28/20 2349 10/30/20 0650 10/31/20 0411  WBC 6.3 5.3 11.7*  NEUTROABS 4.8 4.2 9.8*  HGB 16.2 15.3 14.7  HCT 49.5 47.3 42.3  MCV 82.1 81.8 80.4  PLT 366 405* 374   Basic Metabolic Panel: Recent Labs  Lab 10/28/20 2349 10/29/20 0854 10/30/20 0650 10/31/20 0411  NA 137 139 140 139  K 4.4 4.5 4.3 4.6  CL 102 105 108 109  CO2 19* 22 20* 20*  GLUCOSE 135* 119* 143* 139*  BUN 37* 30* 28* 27*  CREATININE 1.49* 1.21 1.22 1.19  CALCIUM 8.4* 8.2* 8.5* 8.7*  MG  --   --  2.5* 2.4   GFR: Estimated Creatinine Clearance: 67.3 mL/min (by C-G formula based on SCr of 1.19 mg/dL). Liver Function Tests: Recent Labs  Lab 10/28/20 2349 10/30/20 0650 10/31/20 0411  AST 55* 59* 61*  ALT 66* 70* 84*  ALKPHOS 64 67 60  BILITOT 1.4* 0.8 0.8  PROT 7.3 7.2 6.8  ALBUMIN 2.8* 2.7* 2.6*   No results for input(s): LIPASE, AMYLASE in the last 168 hours. Recent Labs  Lab 10/29/20 0854  AMMONIA 34   Coagulation Profile: No results for input(s): INR, PROTIME in the last 168 hours. Cardiac Enzymes: No results for input(s): CKTOTAL, CKMB, CKMBINDEX, TROPONINI in the last 168 hours. BNP (last 3 results) No results for input(s): PROBNP in the last 8760 hours. HbA1C: No results for input(s): HGBA1C in the last 72 hours. CBG: No results for input(s): GLUCAP in the last 168 hours. Lipid Profile: No results for input(s): CHOL, HDL, LDLCALC, TRIG, CHOLHDL, LDLDIRECT in the last 72 hours. Thyroid Function Tests: Recent Labs    10/29/20 0854  TSH 0.989   Anemia Panel: Recent Labs    10/29/20 0854 10/30/20 0650 10/31/20 0411  VITAMINB12 785  --   --   FERRITIN  --  913* 836*   Sepsis Labs: Recent Labs  Lab 10/29/20 0854  PROCALCITON 0.19  LATICACIDVEN 1.3    Recent Results (from the past 240 hour(s))  Resp Panel by RT-PCR (Flu A&B, Covid) Nasopharyngeal Swab     Status: None   Collection Time:  10/29/20  1:01 AM   Specimen: Nasopharyngeal Swab; Nasopharyngeal(NP) swabs in vial transport medium  Result Value Ref Range Status   SARS Coronavirus 2 by RT PCR NEGATIVE NEGATIVE Final    Comment: (NOTE) SARS-CoV-2 target nucleic acids are NOT DETECTED.  The SARS-CoV-2 RNA is generally detectable in upper respiratory specimens during the acute phase of infection. The lowest concentration of SARS-CoV-2 viral copies this assay can detect is 138 copies/mL. A negative result does not preclude SARS-Cov-2 infection and should not be used as the sole basis for treatment or other patient management decisions. A negative result may occur with  improper specimen collection/handling, submission of specimen other than nasopharyngeal swab, presence of viral mutation(s) within the areas targeted by this assay, and inadequate number of viral copies(<138 copies/mL). A negative result must be combined with clinical observations, patient history, and epidemiological information. The expected result is Negative.  Fact Sheet for  Patients:  BloggerCourse.com  Fact Sheet for Healthcare Providers:  SeriousBroker.it  This test is no t yet approved or cleared by the [place] FDA and  has been authorized for detection and/or diagnosis of SARS-CoV-2 by FDA under an Emergency Use Authorization (EUA). This EUA will remain  in effect (meaning this test can be used) for the duration of the COVID-19 declaration under Section 564(b)(1) of the Act, 21 U.S.C.section 360bbb-3(b)(1), unless the authorization is terminated  or revoked sooner.       Influenza A by PCR NEGATIVE NEGATIVE Final   Influenza B by PCR NEGATIVE NEGATIVE Final    Comment: (NOTE) The Xpert Xpress SARS-CoV-2/FLU/RSV plus assay is intended as an aid in the diagnosis of influenza from Nasopharyngeal swab specimens and should not be used as a sole basis for treatment. Nasal washings  and aspirates are unacceptable for Xpert Xpress SARS-CoV-2/FLU/RSV testing.  Fact Sheet for Patients: BloggerCourse.com  Fact Sheet for Healthcare Providers: SeriousBroker.it  This test is not yet approved or cleared by the [place] FDA and has been authorized for detection and/or diagnosis of SARS-CoV-2 by FDA under an Emergency Use Authorization (EUA). This EUA will remain in effect (meaning this test can be used) for the duration of the COVID-19 declaration under Section 564(b)(1) of the Act, 21 U.S.C. section 360bbb-3(b)(1), unless the authorization is terminated or revoked.  Performed at [facility], [address] [place], [place] [address]   SARS CORONAVIRUS 2 (TAT 6-24 HRS) Nasopharyngeal Nasopharyngeal Swab     Status: Abnormal   Collection Time: 10/29/20  6:20 AM   Specimen: Nasopharyngeal Swab  Result Value Ref Range Status   SARS Coronavirus 2 POSITIVE (A) NEGATIVE Final    Comment: (NOTE) SARS-CoV-2 target nucleic acids are DETECTED.  The SARS-CoV-2 RNA is generally detectable in upper and lower respiratory specimens during the acute phase of infection. Positive results are indicative of the presence of SARS-CoV-2 RNA. Clinical correlation with patient history and other diagnostic information is  necessary to determine patient infection status. Positive results do not rule out bacterial infection or co-infection with other viruses.  The expected result is Negative.  Fact Sheet for Patients: HairSlick.no  Fact Sheet for Healthcare Providers: quierodirigir.com  This test is not yet approved or cleared by the [place] FDA and  has been authorized for detection and/or diagnosis of SARS-CoV-2 by FDA under an Emergency Use Authorization (EUA). This EUA will remain  in effect (meaning this test can be used) for the duration of the COVID-19  declaration under Section 564(b)(1) of the Act, 21 U. S.C. section 360bbb-3(b)(1), unless the authorization is terminated or revoked sooner.   Performed at [facility], [address] [place], [place] [address]   Culture, blood (routine x 2)     Status: None (Preliminary result)   Collection Time: 10/29/20  8:54 AM   Specimen: BLOOD RIGHT HAND  Result Value Ref Range Status   Specimen Description BLOOD RIGHT HAND  Final   Special Requests   Final    BOTTLES DRAWN AEROBIC ONLY Blood Culture adequate volume   Culture   Final    NO GROWTH 1 DAY Performed at [facility], [address] [place], [place] [address]    Report Status PENDING  Incomplete  Culture, blood (routine x 2)     Status: None (Preliminary result)   Collection Time: 10/29/20  8:54 AM   Specimen: BLOOD LEFT HAND  Result Value Ref Range Status  Specimen Description BLOOD LEFT HAND  Final   Special Requests   Final    BOTTLES DRAWN AEROBIC ONLY Blood Culture adequate volume   Culture   Final    NO GROWTH 1 DAY Performed at [facility], [address] [place], [place] [address]    Report Status PENDING  Incomplete         Radiology Studies: No results found.      Scheduled Meds: . vitamin C  500 mg Oral Daily  . baricitinib  4 mg Oral Daily  . enoxaparin (LOVENOX) injection  80 mg Subcutaneous Q12H  . feeding supplement  237 mL Oral BID BM  . hydrOXYzine  75 mg Oral QHS  . methylPREDNISolone (SOLU-MEDROL) injection  0.5 mg/kg Intravenous Q12H   Followed by  . [START ON 11/01/2020] predniSONE  50 mg Oral Daily  . mometasone-formoterol  2 puff Inhalation BID   And  . umeclidinium bromide  1 puff Inhalation Daily  . montelukast  10 mg Oral Daily  . pantoprazole  40 mg Oral Daily  . tamsulosin  0.4 mg Oral QHS  . zinc sulfate  220 mg Oral Daily   Continuous Infusions: . remdesivir 100 mg in NS 100 mL Stopped (10/30/20 0956)     LOS: 1 day   Time spent= 35  mins    Tannon Peerson Joline Maxcyhirag Samora Jernberg, MD [facility]  If 7PM-7AM, please contact night-coverage  10/31/2020, 7:39 AM

## 2020-11-01 ENCOUNTER — Inpatient Hospital Stay (HOSPITAL_COMMUNITY): Payer: Medicaid Other

## 2020-11-01 DIAGNOSIS — J189 Pneumonia, unspecified organism: Secondary | ICD-10-CM | POA: Diagnosis not present

## 2020-11-01 DIAGNOSIS — N179 Acute kidney failure, unspecified: Secondary | ICD-10-CM | POA: Diagnosis not present

## 2020-11-01 DIAGNOSIS — I10 Essential (primary) hypertension: Secondary | ICD-10-CM | POA: Diagnosis not present

## 2020-11-01 DIAGNOSIS — G9340 Encephalopathy, unspecified: Secondary | ICD-10-CM | POA: Diagnosis not present

## 2020-11-01 LAB — CBC WITH DIFFERENTIAL/PLATELET
Abs Immature Granulocytes: 0.11 10*3/uL — ABNORMAL HIGH (ref 0.00–0.07)
Basophils Absolute: 0 10*3/uL (ref 0.0–0.1)
Basophils Relative: 0 %
Eosinophils Absolute: 0 10*3/uL (ref 0.0–0.5)
Eosinophils Relative: 0 %
HCT: 42.1 % (ref 39.0–52.0)
Hemoglobin: 14.4 g/dL (ref 13.0–17.0)
Immature Granulocytes: 1 %
Lymphocytes Relative: 7 %
Lymphs Abs: 0.9 10*3/uL (ref 0.7–4.0)
MCH: 27.5 pg (ref 26.0–34.0)
MCHC: 34.2 g/dL (ref 30.0–36.0)
MCV: 80.3 fL (ref 80.0–100.0)
Monocytes Absolute: 0.8 10*3/uL (ref 0.1–1.0)
Monocytes Relative: 6 %
Neutro Abs: 11.1 10*3/uL — ABNORMAL HIGH (ref 1.7–7.7)
Neutrophils Relative %: 86 %
Platelets: 404 10*3/uL — ABNORMAL HIGH (ref 150–400)
RBC: 5.24 MIL/uL (ref 4.22–5.81)
RDW: 11.9 % (ref 11.5–15.5)
WBC: 12.9 10*3/uL — ABNORMAL HIGH (ref 4.0–10.5)
nRBC: 0 % (ref 0.0–0.2)

## 2020-11-01 LAB — COMPREHENSIVE METABOLIC PANEL
ALT: 221 U/L — ABNORMAL HIGH (ref 0–44)
AST: 204 U/L — ABNORMAL HIGH (ref 15–41)
Albumin: 2.7 g/dL — ABNORMAL LOW (ref 3.5–5.0)
Alkaline Phosphatase: 61 U/L (ref 38–126)
Anion gap: 10 (ref 5–15)
BUN: 27 mg/dL — ABNORMAL HIGH (ref 8–23)
CO2: 19 mmol/L — ABNORMAL LOW (ref 22–32)
Calcium: 8.7 mg/dL — ABNORMAL LOW (ref 8.9–10.3)
Chloride: 109 mmol/L (ref 98–111)
Creatinine, Ser: 1.24 mg/dL (ref 0.61–1.24)
GFR, Estimated: >60 mL/min (ref >60)
Glucose, Bld: 135 mg/dL — ABNORMAL HIGH (ref 70–99)
Potassium: 5 mmol/L (ref 3.5–5.1)
Sodium: 138 mmol/L (ref 135–145)
Total Bilirubin: 0.7 mg/dL (ref 0.3–1.2)
Total Protein: 6.6 g/dL (ref 6.5–8.1)

## 2020-11-01 LAB — FERRITIN: Ferritin: 1011 ng/mL — ABNORMAL HIGH (ref 24–336)

## 2020-11-01 LAB — C-REACTIVE PROTEIN: CRP: 2.2 mg/dL — ABNORMAL HIGH (ref <1.0)

## 2020-11-01 LAB — D-DIMER, QUANTITATIVE: D-Dimer, Quant: 1.82 ug/mL-FEU — ABNORMAL HIGH (ref 0.00–0.50)

## 2020-11-01 LAB — MAGNESIUM: Magnesium: 2.3 mg/dL (ref 1.7–2.4)

## 2020-11-01 MED ORDER — GUAIFENESIN-DM 100-10 MG/5ML PO SYRP: 10.0000 mL | ORAL_SOLUTION | ORAL | 0 refills | Status: AC | PRN

## 2020-11-01 MED ORDER — PREDNISONE 50 MG PO TABS: 50.0000 mg | ORAL_TABLET | Freq: Every day | ORAL | 0 refills | Status: AC

## 2020-11-01 MED ORDER — ALBUTEROL SULFATE HFA 108 (90 BASE) MCG/ACT IN AERS
2.0000 | INHALATION_SPRAY | Freq: Four times a day (QID) | RESPIRATORY_TRACT | 1 refills | Status: AC | PRN
Start: 2020-11-01 — End: ?

## 2020-11-01 NOTE — Progress Notes (Signed)
Pt's O2 sat while ambulating in the room on RA was 94%.

## 2020-11-01 NOTE — Progress Notes (Signed)
Pt IV removed, catheter intact. Tele removed, ccmd made aware. Pt has all belongings and understands d/c instructions. Pt d/c via wheelchair by NT.

## 2020-11-01 NOTE — Discharge Summary (Addendum)
Physician Discharge Summary  Cory Leach. ZOX:[id number] DOB: Sep 23, 1961 DOA: 10/28/2020  PCP: Patient, No Pcp Per  Admit date: 10/28/2020 Discharge date: 11/01/2020  Admitted From: Home Disposition: Home  Recommendations for Outpatient Follow-up:  1. Follow up with PCP in 1-2 weeks 2. Please obtain BMP/CBC in one week your next doctors visit.  3. Prednisone orally daily for 7 more days 4. Albuterol inhaler and Robitussin prescribed  Discharge Condition: Stable CODE STATUS: Full code Diet recommendation: Heart healthy  Brief/Interim Summary: 60 year old with history of HTN, BPH, GERD, COPD admitted for altered mental status 1 week prior to admission.  Patient is not vaccinated found to have COVID-positive.  CT head negative.  Admission work-up showed evidence of clinical dehydration, mild AKI and chest x-ray with bilateral opacities.  Started on remdesivir, Solu-Medrol and baricitinib. Over the course of 48 hours his symptoms improved without any shortness of breath. He will be discharged with inhaler and prednisone. Also advised for quadrant ultrasound due to his mild transaminitis.   Assessment & Plan:   Principal Problem:   Pneumonia Active Problems:   AKI (acute kidney injury) (HCC)   Encephalopathy   Metabolic acidosis   HTN (hypertension)  Acute respiratory distress secondary to COVID-19 pneumonia -Oxygen levels- respiratory distress with ambulation.  Currently on 2 L nasal cannula -Remdesivir- day 4,. On the day of discharge -Solumedrol- day 3, transition to 7 more days of p.o. prednisone -Baricitinib -day 2, stop on discharge -Antibiotics- IV Rocephin and azithromycin, will discontinue -procalcitonin- procalcitonin 0.1 -Chest x-ray- bilateral/multifocal infiltrates. Inflammatory markers have trended downwards Mild leukocytosis secondary to steroid use  Acute kidney injury; likely prenal from dehydration - Unknown baseline creatinine.  Admission  creatinine 1.4. Current Cr 1.2  Mild transaminitis - Appears to be secondary to acute illness. Will get right upper quadrant ultrasound prior to discharge shows possible cirrhoris- Follow-up outpatient with PCP  Acute metabolic encephalopathy, resolved - CT head negative. - TSH, ammonia and B12- Normal  Elevated anion gap with metabolic acidosis likely from dehydration - Resolved with fluids  Essential hypertension - Home blood pressure medications on hold  BPH - Flomax  GERD - PPI  History of COPD - Home bronchodilators   Body mass index is 26.44 kg/m.         Discharge Diagnoses:  Principal Problem:   Pneumonia Active Problems:   AKI (acute kidney injury) (HCC)   Encephalopathy   Metabolic acidosis   HTN (hypertension)   Subjective: Feels okay no complaints. Ambulating in his room without any issues. Patient states he wants to stay in the hospital longer but I explained to him at this time there is no indication for him to stay in the hospital any longer and can follow-up outpatient. He agrees.  Discharge Exam: Vitals:   11/01/20 0017 11/01/20 0530  BP: (!) 139/93 (!) 155/89  Pulse: 72 (!) 58  Resp: 18 19  Temp: 97.6 F (36.4 C) 97.7 F (36.5 C)  SpO2: 100% 97%   Vitals:   10/31/20 1124 10/31/20 1837 11/01/20 0017 11/01/20 0530  BP: (!) 136/95 (!) 135/98 (!) 139/93 (!) 155/89  Pulse: 90 73 72 (!) 58  Resp: 16 16 18 19   Temp: (!) 97.5 F (36.4 C) 97.7 F (36.5 C) 97.6 F (36.4 C) 97.7 F (36.5 C)  TempSrc: Oral Oral Oral Oral  SpO2: 92% 92% 100% 97%  Weight:      Height:        General: Pt is alert, awake, not in  acute distress Cardiovascular: RRR, S1/S2 +, no rubs, no gallops Respiratory: Very mild bilateral rhonchi Abdominal: Soft, NT, ND, bowel sounds + Extremities: no edema, no cyanosis  Discharge Instructions   Allergies as of 11/01/2020   No Known Allergies     Medication List    TAKE these medications   albuterol  108 (90 Base) MCG/ACT inhaler Commonly known as: VENTOLIN HFA Inhale 2 puffs into the lungs every 6 (six) hours as needed for wheezing or shortness of breath.   aspirin 81 MG chewable tablet Chew 81 mg by mouth daily.   azelastine 0.1 % nasal spray Commonly known as: ASTELIN Place 1 spray into both nostrils daily as needed for allergies.   Bepreve 1.5 % Soln Generic drug: Bepotastine Besilate Place 1 drop into both eyes 2 (two) times daily.   Breztri Aerosphere 160-9-4.8 MCG/ACT Aero Generic drug: Budeson-Glycopyrrol-Formoterol Inhale 2 puffs into the lungs in the morning and at bedtime.   famotidine 40 MG tablet Commonly known as: PEPCID Take 40 mg by mouth daily.   guaiFENesin-dextromethorphan 100-10 MG/5ML syrup Commonly known as: ROBITUSSIN DM Take 10 mLs by mouth every 4 (four) hours as needed for cough.   hydrochlorothiazide 25 MG tablet Commonly known as: HYDRODIURIL Take 25 mg by mouth daily.   hydrOXYzine 25 MG tablet Commonly known as: ATARAX/VISTARIL Take 75 mg by mouth at bedtime.   lisinopril 5 MG tablet Commonly known as: ZESTRIL Take 5 mg by mouth daily.   montelukast 10 MG tablet Commonly known as: SINGULAIR Take 10 mg by mouth daily.   Olopatadine HCl 0.2 % Soln Place 1 drop into both eyes daily as needed.   omeprazole 40 MG capsule Commonly known as: PRILOSEC Take 40 mg by mouth 2 (two) times daily.   predniSONE 50 MG tablet Commonly known as: DELTASONE Take 1 tablet (50 mg total) by mouth daily for 7 days.   tamsulosin 0.4 MG Caps capsule Commonly known as: FLOMAX Take 0.4 mg by mouth at bedtime.       No Known Allergies  You were cared for by a hospitalist during your hospital stay. If you have any questions about your discharge medications or the care you received while you were in the hospital after you are discharged, you can call the unit and asked to speak with the hospitalist on call if the hospitalist that took care of you is  not available. Once you are discharged, your primary care physician will handle any further medical issues. Please note that no refills for any discharge medications will be authorized once you are discharged, as it is imperative that you return to your primary care physician (or establish a relationship with a primary care physician if you do not have one) for your aftercare needs so that they can reassess your need for medications and monitor your lab values.   Procedures/Studies: CT Head Wo Contrast  Result Date: 10/29/2020 CLINICAL DATA:  Delirium, history of hypertension EXAM: CT HEAD WITHOUT CONTRAST TECHNIQUE: Contiguous axial images were obtained from the base of the skull through the vertex without intravenous contrast. COMPARISON:  None. FINDINGS: Brain: No evidence of acute infarction, hemorrhage, hydrocephalus, extra-axial collection, visible mass lesion or mass effect. Symmetric prominence of the ventricles, cisterns and sulci compatible with parenchymal volume loss. Patchy areas of white matter hypoattenuation are most compatible with chronic microvascular angiopathy. Vascular: Atherosclerotic calcification of the level of the skull base. No concerning hyperdense vessel. Skull: No significant scalp swelling or hematoma. No calvarial fracture conspicuous  osseous lesions. Likely remote deformity of the right nasal bone. Sinuses/Orbits: Minimal thickening in the ethmoids, otherwise the paranasal sinuses and mastoid air cells are predominantly clear. Middle ear cavities are clear. Included orbital structures are unremarkable. Other: None IMPRESSION: 1. No acute intracranial findings. 2. Mild parenchymal volume loss and chronic microvascular angiopathy. Electronically Signed   By: Kreg Shropshire M.D.   On: 10/29/2020 01:44   DG Chest Port 1 View  Result Date: 10/29/2020 CLINICAL DATA:  Change in mental status EXAM: PORTABLE CHEST 1 VIEW COMPARISON:  April 18, 2019 FINDINGS: The heart size and  mediastinal contours are within normal limits. Multifocal patchy airspace opacities seen predominantly within the periphery of the lower lungs. No pleural effusion is seen. The visualized skeletal structures are unremarkable. IMPRESSION: Multifocal airspace opacities throughout both lungs predominantly within the lower lungs likely consistent multifocal pneumonia Electronically Signed   By: Jonna Clark M.D.   On: 10/29/2020 00:04      The results of significant diagnostics from this hospitalization (including imaging, microbiology, ancillary and laboratory) are listed below for reference.     Microbiology: Recent Results (from the past 240 hour(s))  Resp Panel by RT-PCR (Flu A&B, Covid) Nasopharyngeal Swab     Status: None   Collection Time: 10/29/20  1:01 AM   Specimen: Nasopharyngeal Swab; Nasopharyngeal(NP) swabs in vial transport medium  Result Value Ref Range Status   SARS Coronavirus 2 by RT PCR NEGATIVE NEGATIVE Final    Comment: (NOTE) SARS-CoV-2 target nucleic acids are NOT DETECTED.  The SARS-CoV-2 RNA is generally detectable in upper respiratory specimens during the acute phase of infection. The lowest concentration of SARS-CoV-2 viral copies this assay can detect is 138 copies/mL. A negative result does not preclude SARS-Cov-2 infection and should not be used as the sole basis for treatment or other patient management decisions. A negative result may occur with  improper specimen collection/handling, submission of specimen other than nasopharyngeal swab, presence of viral mutation(s) within the areas targeted by this assay, and inadequate number of viral copies(<138 copies/mL). A negative result must be combined with clinical observations, patient history, and epidemiological information. The expected result is Negative.  Fact Sheet for Patients:  BloggerCourse.com  Fact Sheet for Healthcare Providers:   SeriousBroker.it  This test is no t yet approved or cleared by the [place] FDA and  has been authorized for detection and/or diagnosis of SARS-CoV-2 by FDA under an Emergency Use Authorization (EUA). This EUA will remain  in effect (meaning this test can be used) for the duration of the COVID-19 declaration under Section 564(b)(1) of the Act, 21 U.S.C.section 360bbb-3(b)(1), unless the authorization is terminated  or revoked sooner.       Influenza A by PCR NEGATIVE NEGATIVE Final   Influenza B by PCR NEGATIVE NEGATIVE Final    Comment: (NOTE) The Xpert Xpress SARS-CoV-2/FLU/RSV plus assay is intended as an aid in the diagnosis of influenza from Nasopharyngeal swab specimens and should not be used as a sole basis for treatment. Nasal washings and aspirates are unacceptable for Xpert Xpress SARS-CoV-2/FLU/RSV testing.  Fact Sheet for Patients: BloggerCourse.com  Fact Sheet for Healthcare Providers: SeriousBroker.it  This test is not yet approved or cleared by the [place] FDA and has been authorized for detection and/or diagnosis of SARS-CoV-2 by FDA under an Emergency Use Authorization (EUA). This EUA will remain in effect (meaning this test can be used) for the duration of the COVID-19 declaration under Section 564(b)(1) of the Act, 21 U.S.C.  section 360bbb-3(b)(1), unless the authorization is terminated or revoked.  Performed at [facility], [address], [place], [place] [address]   SARS CORONAVIRUS 2 (TAT 6-24 HRS) Nasopharyngeal Nasopharyngeal Swab     Status: Abnormal   Collection Time: 10/29/20  6:20 AM   Specimen: Nasopharyngeal Swab  Result Value Ref Range Status   SARS Coronavirus 2 POSITIVE (A) NEGATIVE Final    Comment: (NOTE) SARS-CoV-2 target nucleic acids are DETECTED.  The SARS-CoV-2 RNA is generally detectable in upper and lower respiratory specimens during  the acute phase of infection. Positive results are indicative of the presence of SARS-CoV-2 RNA. Clinical correlation with patient history and other diagnostic information is  necessary to determine patient infection status. Positive results do not rule out bacterial infection or co-infection with other viruses.  The expected result is Negative.  Fact Sheet for Patients: HairSlick.no  Fact Sheet for Healthcare Providers: quierodirigir.com  This test is not yet approved or cleared by the [place] FDA and  has been authorized for detection and/or diagnosis of SARS-CoV-2 by FDA under an Emergency Use Authorization (EUA). This EUA will remain  in effect (meaning this test can be used) for the duration of the COVID-19 declaration under Section 564(b)(1) of the Act, 21 U. S.C. section 360bbb-3(b)(1), unless the authorization is terminated or revoked sooner.   Performed at [facility], [address], [place], [place] [address]   Culture, blood (routine x 2)     Status: None (Preliminary result)   Collection Time: 10/29/20  8:54 AM   Specimen: BLOOD RIGHT HAND  Result Value Ref Range Status   Specimen Description BLOOD RIGHT HAND  Final   Special Requests   Final    BOTTLES DRAWN AEROBIC ONLY Blood Culture adequate volume   Culture   Final    NO GROWTH 3 DAYS Performed at [facility], [address] [place], [place] [address]    Report Status PENDING  Incomplete  Culture, blood (routine x 2)     Status: None (Preliminary result)   Collection Time: 10/29/20  8:54 AM   Specimen: BLOOD LEFT HAND  Result Value Ref Range Status   Specimen Description BLOOD LEFT HAND  Final   Special Requests   Final    BOTTLES DRAWN AEROBIC ONLY Blood Culture adequate volume   Culture   Final    NO GROWTH 3 DAYS Performed at [facility], [address] [place], [place] [address]    Report Status PENDING  Incomplete      Labs: BNP (last 3 results) No results for input(s): BNP in the last 8760 hours. Basic Metabolic Panel: Recent Labs  Lab 10/28/20 2349 10/29/20 0854 10/30/20 0650 10/31/20 0411 11/01/20 0313  NA 137 139 140 139 138  K 4.4 4.5 4.3 4.6 5.0  CL 102 105 108 109 109  CO2 19* 22 20* 20* 19*  GLUCOSE 135* 119* 143* 139* 135*  BUN 37* 30* 28* 27* 27*  CREATININE 1.49* 1.21 1.22 1.19 1.24  CALCIUM 8.4* 8.2* 8.5* 8.7* 8.7*  MG  --   --  2.5* 2.4 2.3   Liver Function Tests: Recent Labs  Lab 10/28/20 2349 10/30/20 0650 10/31/20 0411 11/01/20 0313  AST 55* 59* 61* 204*  ALT 66* 70* 84* 221*  ALKPHOS 64 67 60 61  BILITOT 1.4* 0.8 0.8 0.7  PROT 7.3 7.2 6.8 6.6  ALBUMIN 2.8* 2.7* 2.6* 2.7*   No results for input(s): LIPASE,  AMYLASE in the last 168 hours. Recent Labs  Lab 10/29/20 0854  AMMONIA 34   CBC: Recent Labs  Lab 10/28/20 2349 10/30/20 0650 10/31/20 0411 11/01/20 0313  WBC 6.3 5.3 11.7* 12.9*  NEUTROABS 4.8 4.2 9.8* 11.1*  HGB 16.2 15.3 14.7 14.4  HCT 49.5 47.3 42.3 42.1  MCV 82.1 81.8 80.4 80.3  PLT 366 405* 374 404*   Cardiac Enzymes: No results for input(s): CKTOTAL, CKMB, CKMBINDEX, TROPONINI in the last 168 hours. BNP: Invalid input(s): POCBNP CBG: No results for input(s): GLUCAP in the last 168 hours. D-Dimer Recent Labs    10/31/20 0411 11/01/20 0313  DDIMER 1.78* 1.82*   Hgb A1c No results for input(s): HGBA1C in the last 72 hours. Lipid Profile No results for input(s): CHOL, HDL, LDLCALC, TRIG, CHOLHDL, LDLDIRECT in the last 72 hours. Thyroid function studies No results for input(s): TSH, T4TOTAL, T3FREE, THYROIDAB in the last 72 hours.  Invalid input(s): FREET3 Anemia work up Recent Labs    10/31/20 0411 11/01/20 0313  FERRITIN 836* 1,011*   Urinalysis    Component Value Date/Time   COLORURINE YELLOW 10/29/2020 0238   APPEARANCEUR CLEAR 10/29/2020 0238   LABSPEC 1.014 10/29/2020 0238   PHURINE 5.0 10/29/2020 0238   GLUCOSEU  NEGATIVE 10/29/2020 0238   HGBUR SMALL (A) 10/29/2020 0238   BILIRUBINUR NEGATIVE 10/29/2020 0238   KETONESUR NEGATIVE 10/29/2020 0238   PROTEINUR 30 (A) 10/29/2020 0238   NITRITE NEGATIVE 10/29/2020 0238   LEUKOCYTESUR NEGATIVE 10/29/2020 0238   Sepsis Labs Invalid input(s): PROCALCITONIN,  WBC,  LACTICIDVEN Microbiology Recent Results (from the past 240 hour(s))  Resp Panel by RT-PCR (Flu A&B, Covid) Nasopharyngeal Swab     Status: None   Collection Time: 10/29/20  1:01 AM   Specimen: Nasopharyngeal Swab; Nasopharyngeal(NP) swabs in vial transport medium  Result Value Ref Range Status   SARS Coronavirus 2 by RT PCR NEGATIVE NEGATIVE Final    Comment: (NOTE) SARS-CoV-2 target nucleic acids are NOT DETECTED.  The SARS-CoV-2 RNA is generally detectable in upper respiratory specimens during the acute phase of infection. The lowest concentration of SARS-CoV-2 viral copies this assay can detect is 138 copies/mL. A negative result does not preclude SARS-Cov-2 infection and should not be used as the sole basis for treatment or other patient management decisions. A negative result may occur with  improper specimen collection/handling, submission of specimen other than nasopharyngeal swab, presence of viral mutation(s) within the areas targeted by this assay, and inadequate number of viral copies(<138 copies/mL). A negative result must be combined with clinical observations, patient history, and epidemiological information. The expected result is Negative.  Fact Sheet for Patients:  BloggerCourse.comhttps://www.fda.gov/media/152166/download  Fact Sheet for Healthcare Providers:  SeriousBroker.ithttps://www.fda.gov/media/152162/download  This test is no t yet approved or cleared by the [place] FDA and  has been authorized for detection and/or diagnosis of SARS-CoV-2 by FDA under an Emergency Use Authorization (EUA). This EUA will remain  in effect (meaning this test can be used) for the duration of  the COVID-19 declaration under Section 564(b)(1) of the Act, 21 U.S.C.section 360bbb-3(b)(1), unless the authorization is terminated  or revoked sooner.       Influenza A by PCR NEGATIVE NEGATIVE Final   Influenza B by PCR NEGATIVE NEGATIVE Final    Comment: (NOTE) The Xpert Xpress SARS-CoV-2/FLU/RSV plus assay is intended as an aid in the diagnosis of influenza from Nasopharyngeal swab specimens and should not be used as a sole basis for treatment. Nasal washings and aspirates are  unacceptable for Xpert Xpress SARS-CoV-2/FLU/RSV testing.  Fact Sheet for Patients: BloggerCourse.com  Fact Sheet for Healthcare Providers: SeriousBroker.it  This test is not yet approved or cleared by the [place] FDA and has been authorized for detection and/or diagnosis of SARS-CoV-2 by FDA under an Emergency Use Authorization (EUA). This EUA will remain in effect (meaning this test can be used) for the duration of the COVID-19 declaration under Section 564(b)(1) of the Act, 21 U.S.C. section 360bbb-3(b)(1), unless the authorization is terminated or revoked.  Performed at [facility], [address] [place], [place] [address]   SARS CORONAVIRUS 2 (TAT 6-24 HRS) Nasopharyngeal Nasopharyngeal Swab     Status: Abnormal   Collection Time: 10/29/20  6:20 AM   Specimen: Nasopharyngeal Swab  Result Value Ref Range Status   SARS Coronavirus 2 POSITIVE (A) NEGATIVE Final    Comment: (NOTE) SARS-CoV-2 target nucleic acids are DETECTED.  The SARS-CoV-2 RNA is generally detectable in upper and lower respiratory specimens during the acute phase of infection. Positive results are indicative of the presence of SARS-CoV-2 RNA. Clinical correlation with patient history and other diagnostic information is  necessary to determine patient infection status. Positive results do not rule out bacterial infection or co-infection with other viruses.  The  expected result is Negative.  Fact Sheet for Patients: HairSlick.no  Fact Sheet for Healthcare Providers: quierodirigir.com  This test is not yet approved or cleared by the [place] FDA and  has been authorized for detection and/or diagnosis of SARS-CoV-2 by FDA under an Emergency Use Authorization (EUA). This EUA will remain  in effect (meaning this test can be used) for the duration of the COVID-19 declaration under Section 564(b)(1) of the Act, 21 U. S.C. section 360bbb-3(b)(1), unless the authorization is terminated or revoked sooner.   Performed at [facility], [address] [place], [place] [address]   Culture, blood (routine x 2)     Status: None (Preliminary result)   Collection Time: 10/29/20  8:54 AM   Specimen: BLOOD RIGHT HAND  Result Value Ref Range Status   Specimen Description BLOOD RIGHT HAND  Final   Special Requests   Final    BOTTLES DRAWN AEROBIC ONLY Blood Culture adequate volume   Culture   Final    NO GROWTH 3 DAYS Performed at [facility], [address], [place], [place] [address]    Report Status PENDING  Incomplete  Culture, blood (routine x 2)     Status: None (Preliminary result)   Collection Time: 10/29/20  8:54 AM   Specimen: BLOOD LEFT HAND  Result Value Ref Range Status   Specimen Description BLOOD LEFT HAND  Final   Special Requests   Final    BOTTLES DRAWN AEROBIC ONLY Blood Culture adequate volume   Culture   Final    NO GROWTH 3 DAYS Performed at [facility], [address] [place], [place] [address]    Report Status PENDING  Incomplete     Time coordinating discharge:  I have spent 35 minutes face to face with the patient and on the ward discussing the patients care, assessment, plan and disposition with other care givers. >50% of the time was devoted counseling the patient about the risks and benefits of treatment/Discharge disposition and  coordinating care.   SIGNED:   Dimple Nanas, MD  [facility] 11/01/2020, 11:23 AM   If 7PM-7AM, please contact night-coverage

## 2020-11-03 LAB — CULTURE, BLOOD (ROUTINE X 2)
Culture: NO GROWTH
Culture: NO GROWTH
Special Requests: ADEQUATE
Special Requests: ADEQUATE

## 2021-02-12 ENCOUNTER — Other Ambulatory Visit: Payer: Self-pay | Admitting: *Deleted

## 2021-02-12 DIAGNOSIS — I83893 Varicose veins of bilateral lower extremities with other complications: Secondary | ICD-10-CM

## 2021-03-04 ENCOUNTER — Encounter (HOSPITAL_COMMUNITY): Payer: Medicaid Other

## 2021-03-05 ENCOUNTER — Encounter (HOSPITAL_COMMUNITY): Payer: Medicaid Other

## 2021-03-05 ENCOUNTER — Ambulatory Visit (HOSPITAL_COMMUNITY)
Admission: RE | Admit: 2021-03-05 | Discharge: 2021-03-05 | Disposition: A | Discharge status: Home / Self Care | Payer: Medicaid Other | Source: Ambulatory Visit | Attending: Vascular Surgery | Admitting: Vascular Surgery

## 2021-03-05 ENCOUNTER — Ambulatory Visit (INDEPENDENT_AMBULATORY_CARE_PROVIDER_SITE_OTHER): Payer: Medicaid Other | Admitting: Vascular Surgery

## 2021-03-05 ENCOUNTER — Encounter: Payer: Self-pay | Admitting: Vascular Surgery

## 2021-03-05 ENCOUNTER — Other Ambulatory Visit: Payer: Self-pay

## 2021-03-05 VITALS — BP 128/86 | HR 71 | Temp 98.3°F | Resp 20 | Ht 70.0 in | Wt 191.7 lb

## 2021-03-05 DIAGNOSIS — I83893 Varicose veins of bilateral lower extremities with other complications: Secondary | ICD-10-CM | POA: Diagnosis present

## 2021-03-05 DIAGNOSIS — M25562 Pain in left knee: Secondary | ICD-10-CM | POA: Diagnosis not present

## 2021-03-05 NOTE — Progress Notes (Signed)
Patient ID: Cory Oravec., male   DOB: 09/21/1961, 60 y.o.   MRN: [id number]  Reason for Consult: New Patient (Initial Visit) and Varicose Veins   Referred by Adela Glimpse, NP  Subjective:     HPI:  Cory Sermon. is a 60 y.o. male without significant vascular disease.  He was having pain in his left leg he underwent ultrasound to evaluate for DVT as well as x-ray.  These were negative but did demonstrate some reflux.  He now follows up saying that his leg is feeling much better.  He does have some warmth in his leg occasionally.  He denies any previous history of injury.  Denies history of DVT.  No family history of DVT.  No family history of varicose veins.  No previous vascular disease.  Patient does not take blood thinners.  Past Medical History:  Diagnosis Date  . Hypertension    No family history on file. Past Surgical History:  Procedure Laterality Date  . hammer toe repair      Short Social History:  Social History   Tobacco Use  . Smoking status: Current Every Day Smoker  . Smokeless tobacco: Never Used  Substance Use Topics  . Alcohol use: Not on file    No Known Allergies  Current Outpatient Medications  Medication Sig Dispense Refill  . albuterol (VENTOLIN HFA) 108 (90 Base) MCG/ACT inhaler Inhale 2 puffs into the lungs every 6 (six) hours as needed for wheezing or shortness of breath. 1 each 1  . aspirin 81 MG chewable tablet Chew 81 mg by mouth daily.    Marland Kitchen azelastine (ASTELIN) 0.1 % nasal spray Place 1 spray into both nostrils daily as needed for allergies.    Marland Kitchen BEPREVE 1.5 % SOLN Place 1 drop into both eyes 2 (two) times daily.    Marland Kitchen BREZTRI AEROSPHERE 160-9-4.8 MCG/ACT AERO Inhale 2 puffs into the lungs in the morning and at bedtime.    . famotidine (PEPCID) 40 MG tablet Take 40 mg by mouth daily.    Marland Kitchen guaiFENesin-dextromethorphan (ROBITUSSIN DM) 100-10 MG/5ML syrup Take 10 mLs by mouth every 4 (four) hours as needed for cough. 118  mL 0  . hydrochlorothiazide (HYDRODIURIL) 25 MG tablet Take 25 mg by mouth daily.    . hydrOXYzine (ATARAX/VISTARIL) 25 MG tablet Take 75 mg by mouth at bedtime.    Marland Kitchen lisinopril (ZESTRIL) 5 MG tablet Take 5 mg by mouth daily.    . montelukast (SINGULAIR) 10 MG tablet Take 10 mg by mouth daily.    . Olopatadine HCl 0.2 % SOLN Place 1 drop into both eyes daily as needed.    Marland Kitchen omeprazole (PRILOSEC) 40 MG capsule Take 40 mg by mouth 2 (two) times daily.    . tamsulosin (FLOMAX) 0.4 MG CAPS capsule Take 0.4 mg by mouth at bedtime.     No current facility-administered medications for this visit.    Review of Systems  Constitutional:  Constitutional negative. HENT: HENT negative.  Eyes: Eyes negative.  Respiratory: Respiratory negative.  Cardiovascular: Cardiovascular negative.  GI: Gastrointestinal negative.  Skin: Skin negative.  Neurological: Neurological negative. Hematologic: Hematologic/lymphatic negative.  Psychiatric: Psychiatric negative.        Objective:  Objective   Vitals:   03/05/21 1033  BP: 128/86  Pulse: 71  Resp: 20  Temp: 98.3 F (36.8 C)  SpO2: 96%  Weight: 191 lb 11.2 oz (87 kg)  Height: 5\' 10"  (1.778 m)   Body mass index  is 27.51 kg/m.  Physical Exam HENT:     Head: Normocephalic.     Nose:     Comments: Wearing a mask Eyes:     Pupils: Pupils are equal, round, and reactive to light.  Cardiovascular:     Rate and Rhythm: Normal rate.     Pulses: Normal pulses.  Pulmonary:     Effort: Pulmonary effort is normal.  Abdominal:     General: Abdomen is flat.     Palpations: Abdomen is soft. There is no mass.  Musculoskeletal:        General: Normal range of motion.     Cervical back: Normal range of motion and neck supple.     Right lower leg: No edema.     Left lower leg: No edema.  Skin:    General: Skin is warm.     Capillary Refill: Capillary refill takes less than 2 seconds.  Neurological:     General: No focal deficit present.      Mental Status: He is alert.  Psychiatric:        Mood and Affect: Mood normal.        Behavior: Behavior normal.        Thought Content: Thought content normal.        Judgment: Judgment normal.     Data: +--------------+---------+------+-----------+------------+--------+  LEFT     Reflux NoRefluxReflux TimeDiameter cmsComments               Yes                   +--------------+---------+------+-----------+------------+--------+  CFV      no                         +--------------+---------+------+-----------+------------+--------+  FV prox    no                         +--------------+---------+------+-----------+------------+--------+  FV mid    no                         +--------------+---------+------+-----------+------------+--------+  FV dist    no                         +--------------+---------+------+-----------+------------+--------+  Popliteal   no                         +--------------+---------+------+-----------+------------+--------+  GSV at [facility]  no               0.444        +--------------+---------+------+-----------+------------+--------+  GSV prox thighno               0.379        +--------------+---------+------+-----------+------------+--------+  GSV mid thigh no               0.383        +--------------+---------+------+-----------+------------+--------+  GSV dist thighno               0.379        +--------------+---------+------+-----------+------------+--------+  GSV at knee  no               0.342        +--------------+---------+------+-----------+------------+--------+  GSV prox calf                 0.402         +--------------+---------+------+-----------+------------+--------+  GSV mid calf                 0.283        +--------------+---------+------+-----------+------------+--------+  SSV Pop Fossa no               0.347        +--------------+---------+------+-----------+------------+--------+  SSV prox calf no               0.242        +--------------+---------+------+-----------+------------+--------+  SSV mid calf                 0.306        +--------------+---------+------+-----------+------------+--------+      Summary:  Left:  - No evidence of deep vein thrombosis seen in the left lower extremity,  from the common femoral through the popliteal veins.  - No evidence of superficial venous thrombosis in the left lower  extremity.  - There is no evidence of venous reflux seen in the left lower extremity.  - No evidence of superficial venous reflux seen in the left greater  saphenous vein.  - No evidence of superficial venous reflux seen in the left short  saphenous vein.  - Limited imaging showed triphasic Doppler waveforms in the distal SFA  with no significant stenosis.         Assessment/Plan:     60 year old male had acute pain a few weeks ago in his left lower extremity with concern for reflux as a cause.  We did not identify any significant reflux other deep vein or superficial vein today.  From the standpoint he can follow-up with me on an as-needed basis.  Activity as tolerated     Maeola Harman MD [facility]

## 2021-08-17 IMAGING — DX DG CHEST 1V PORT
1 series · 1 of 1 positions shown · non-contrast
Comparison: April 18, 2019

CLINICAL DATA: Change in mental status

EXAM:
PORTABLE CHEST 1 VIEW

[chest ap]
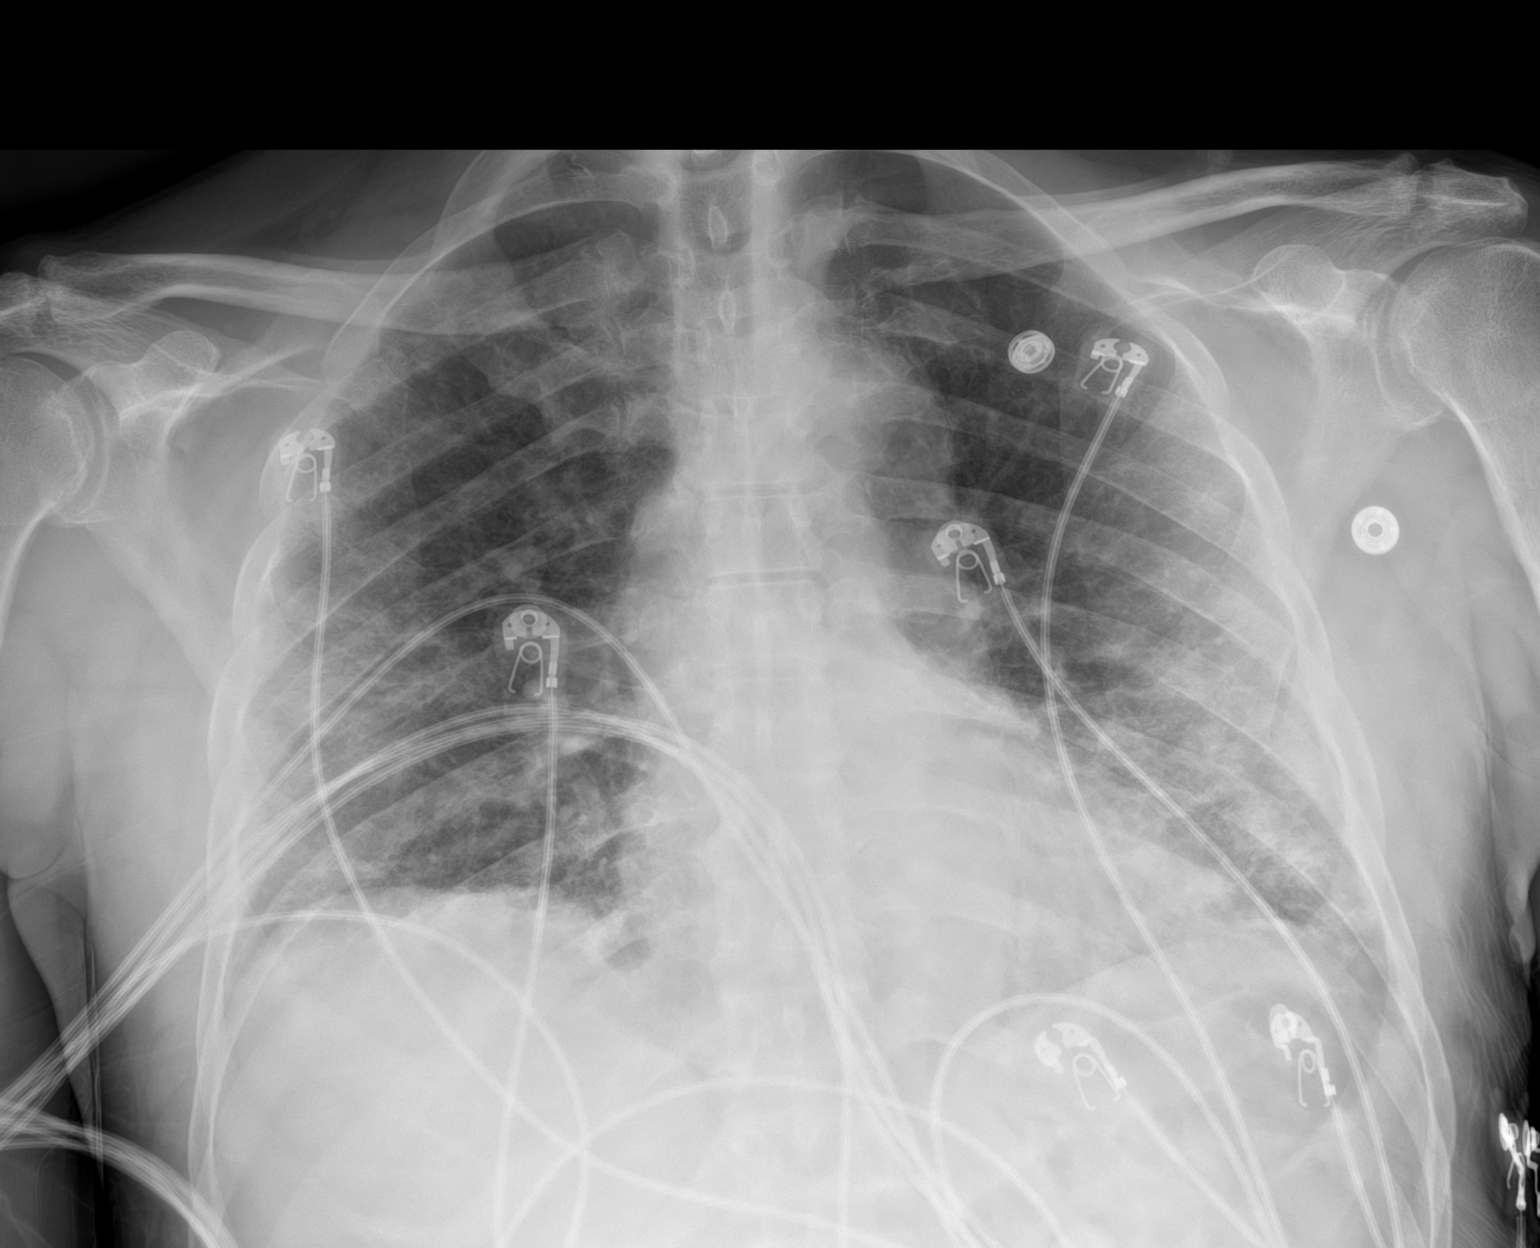

[1 of 1 positions shown; findings below may reference images not displayed]

FINDINGS: The heart size and mediastinal contours are within normal limits.
Multifocal patchy airspace opacities seen predominantly within the
periphery of the lower lungs. No pleural effusion is seen. The
visualized skeletal structures are unremarkable.
IMPRESSION: Multifocal airspace opacities throughout both lungs predominantly
within the lower lungs likely consistent multifocal pneumonia

## 2021-08-21 IMAGING — US US ABDOMEN LIMITED
1 series · 14 of 25 positions shown · non-contrast
Comparison: None.

CLINICAL DATA: 59-year-old male with elevated liver enzymes.

EXAM:
ULTRASOUND ABDOMEN LIMITED RIGHT UPPER QUADRANT

[Series 1: us abdomen limited ruq (liver/gb) · 14 of 40 slices shown]
[im 1/40]
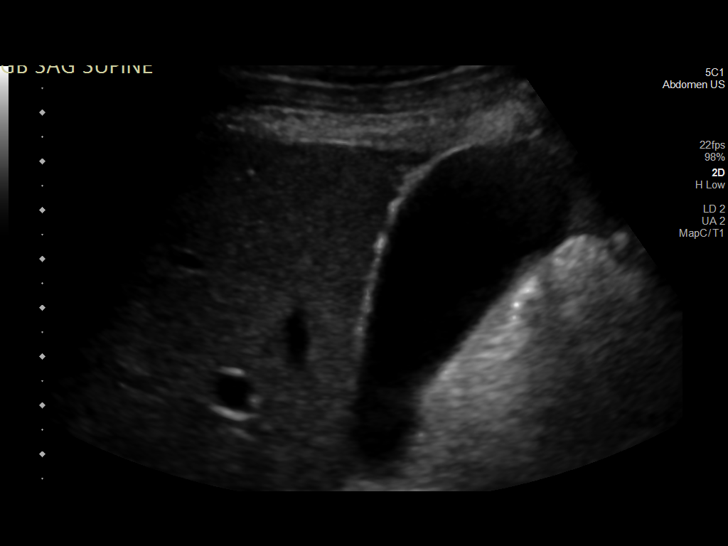
[im 4/40]
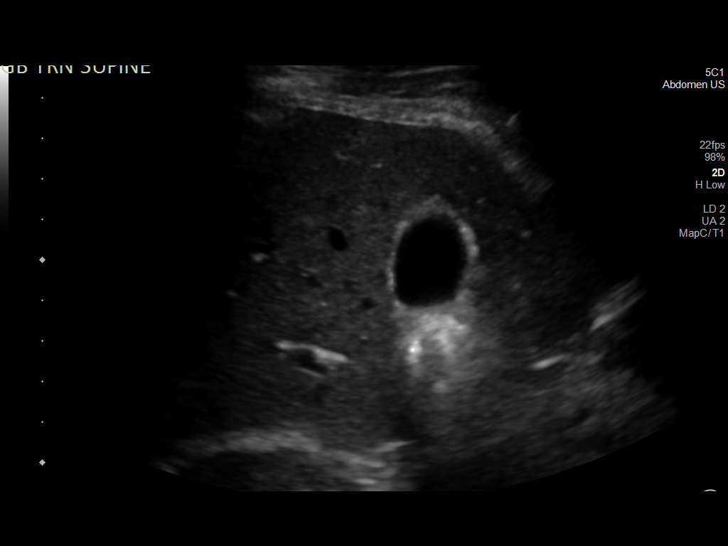
[im 7/40]
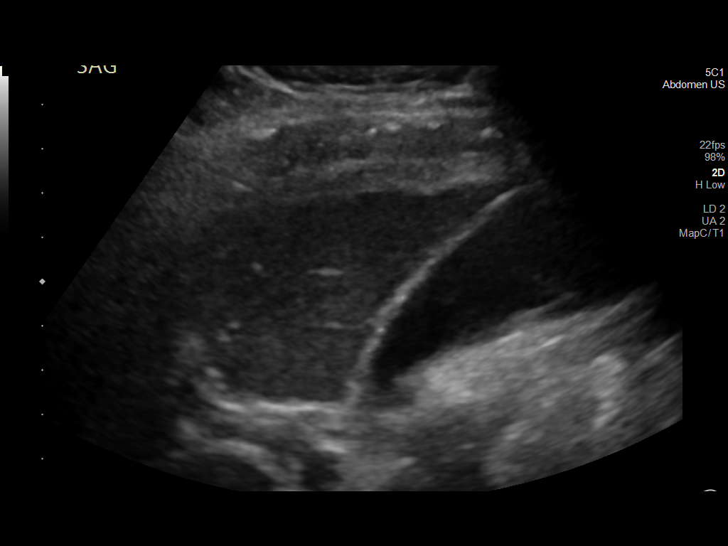
[im 10/40]
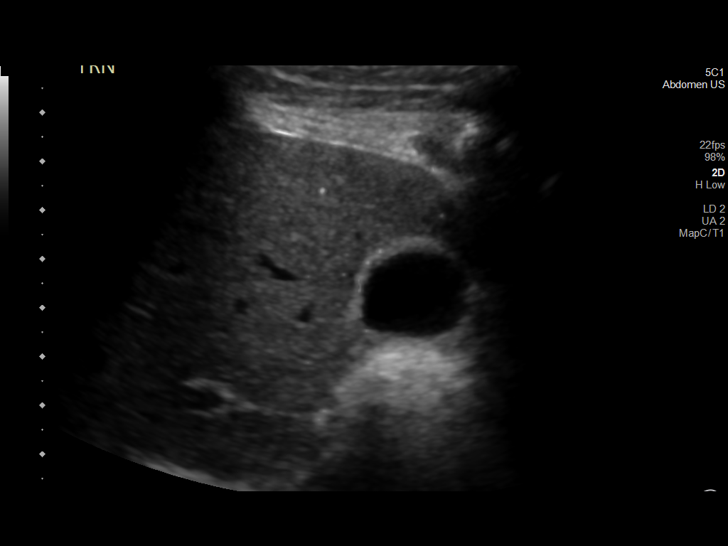
[im 14/40]
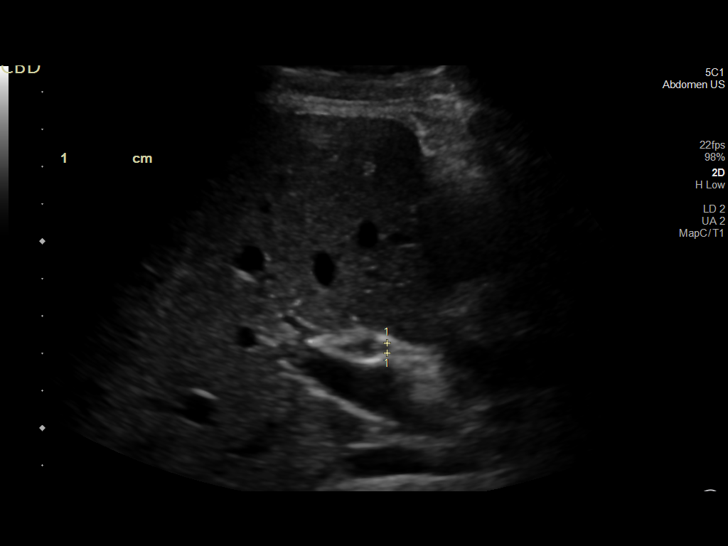
[im 15/40]
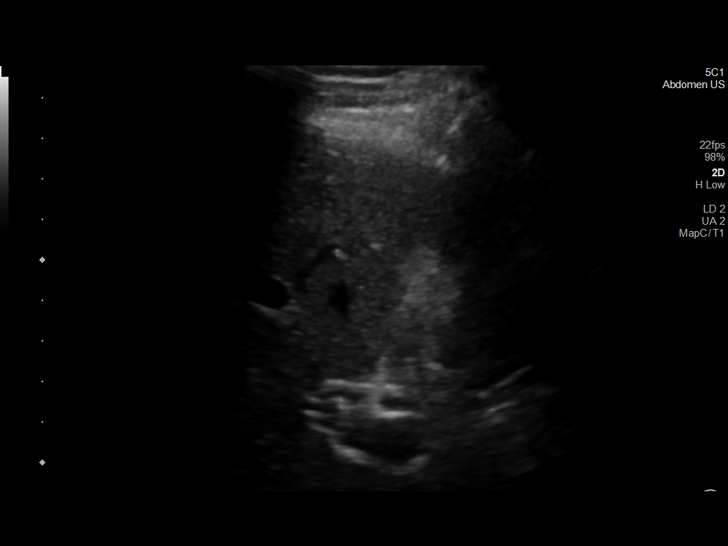
[im 18/40]
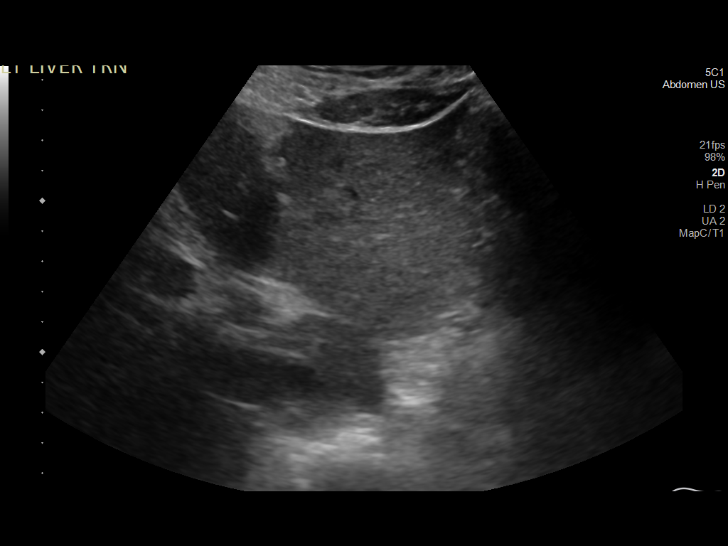
[im 22/40]
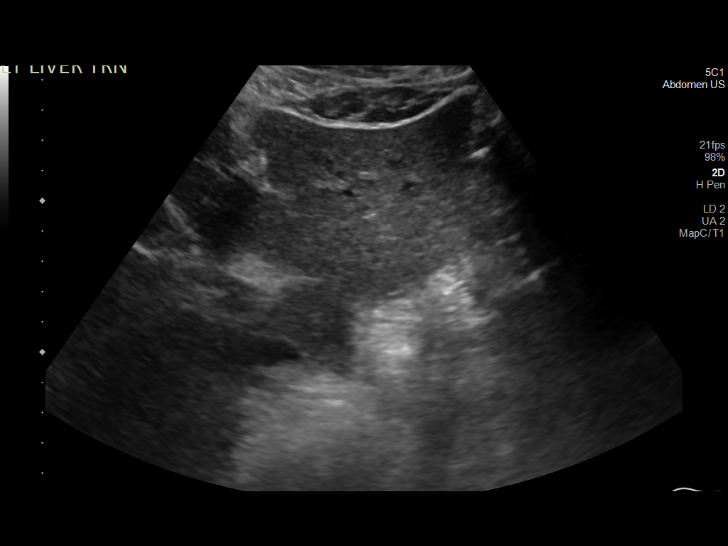
[im 25/40]
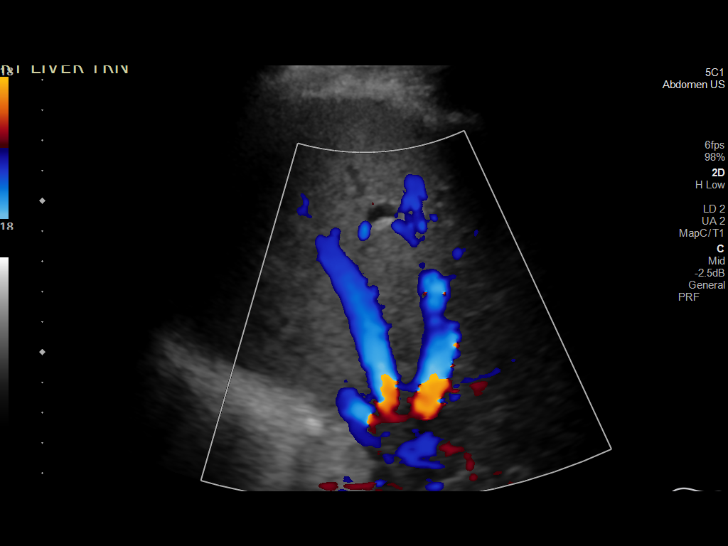
[im 27/40]
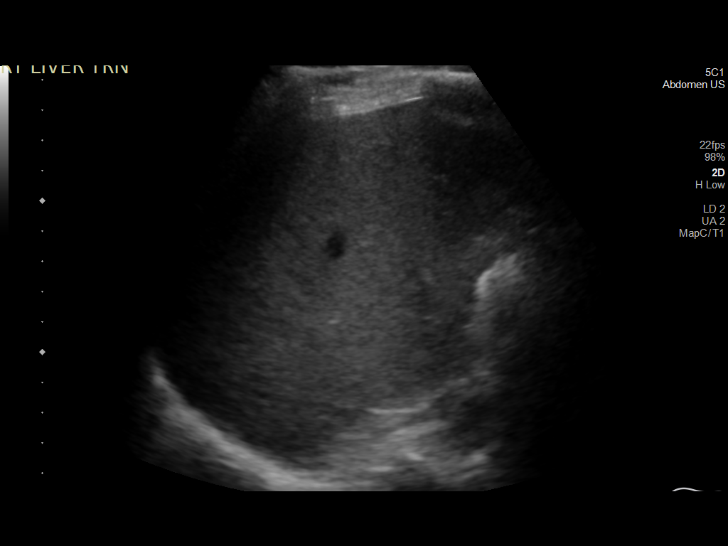
[im 30/40]
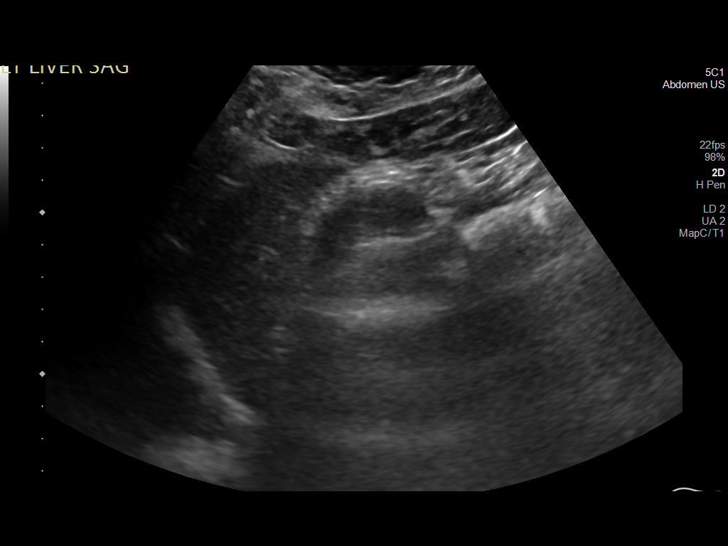
[im 33/40]
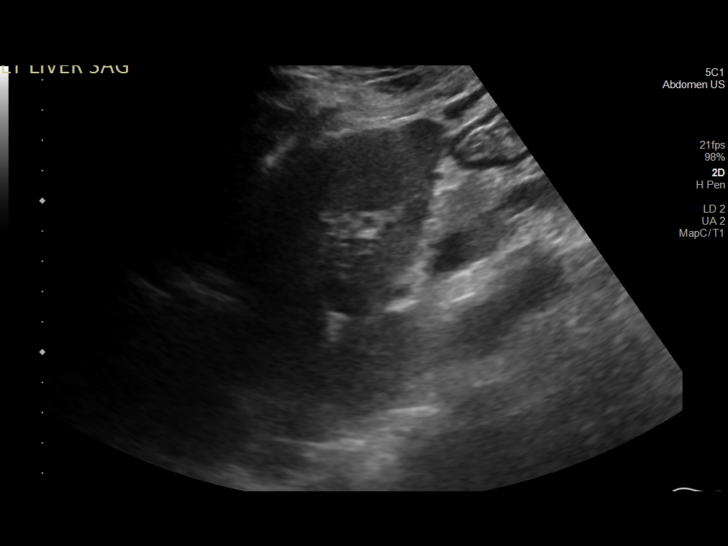
[im 36/40]
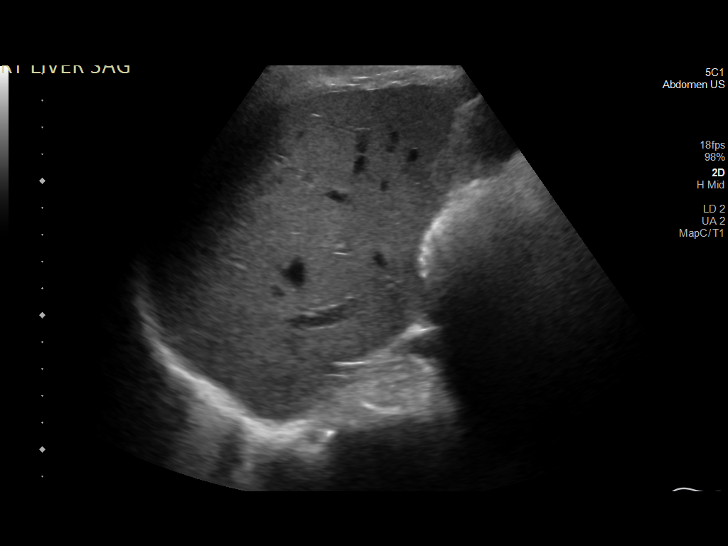
[im 40/40]
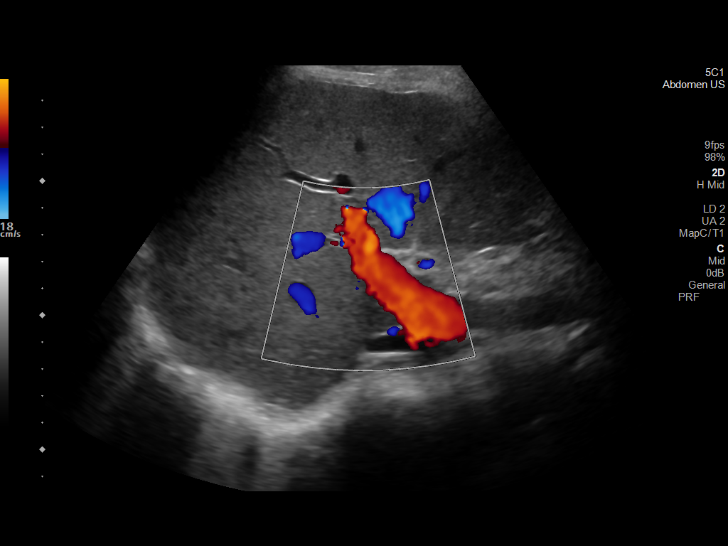

[14 of 25 positions shown; findings below may reference images not displayed]

FINDINGS: Gallbladder:

No gallstones or wall thickening visualized. No sonographic Murphy
sign noted by sonographer.

Common bile duct:

Diameter: 3 mm

Liver:

There is slight coarsened liver echotexture with mild surface
nodularity concerning for early cirrhosis. Clinical correlation is
recommended. Portal vein is patent on color Doppler imaging with
normal direction of blood flow towards the liver.

Other: None.
IMPRESSION: 1. No gallstone.
2. Probable morphologic changes of cirrhosis.

## 2022-09-16 ENCOUNTER — Ambulatory Visit: Payer: Medicaid Other | Admitting: Podiatry

## 2022-09-19 ENCOUNTER — Ambulatory Visit (INDEPENDENT_AMBULATORY_CARE_PROVIDER_SITE_OTHER): Payer: Medicaid Other | Admitting: Podiatry

## 2022-09-19 DIAGNOSIS — M79674 Pain in right toe(s): Secondary | ICD-10-CM | POA: Diagnosis not present

## 2022-09-19 DIAGNOSIS — B351 Tinea unguium: Secondary | ICD-10-CM

## 2022-09-19 DIAGNOSIS — M79675 Pain in left toe(s): Secondary | ICD-10-CM | POA: Diagnosis not present

## 2022-09-19 MED ORDER — CICLOPIROX 8 % EX SOLN: Freq: Every day | CUTANEOUS | 0 refills | Status: AC

## 2022-09-19 MED ORDER — TERBINAFINE HCL 250 MG PO TABS: 250.0000 mg | ORAL_TABLET | Freq: Every day | ORAL | 2 refills | Status: AC

## 2022-09-19 NOTE — Progress Notes (Signed)
  Subjective:  Patient ID: Cory Slayden., male    DOB: 1961/09/19,  MRN: [id number]  Chief Complaint  Patient presents with   Nail Problem    Bilateral hallux-Nail thickness and painful at times when it hurts his shoes    61 y.o. male presents with concern for bilateral hallux nail thickness and abnormal growth.  He says the nails of the bilateral hallux are  thick and painful.  Has pain with the nails when he is wearing shoes and pressure on the top of the nails with his shoes.  He is having difficulty trimming all of his nails.  Past Medical History:  Diagnosis Date   Hypertension     No Known Allergies  ROS: Negative except as per HPI above  Objective:  General: AAO x3, NAD  Dermatological: Bilateral hallux nails with significant thickening discoloration dystrophic growth related to fungal infection.  Remainder of nails also with discoloration darkening thickening and abnormal growth.  Pain with palpation dorsally of the bilateral hallux nails.  Vascular:  Dorsalis Pedis artery and Posterior Tibial artery pedal pulses are 2/4 bilateral.  Capillary fill time < 3 sec to all digits.   Neruologic: Grossly intact via light touch bilateral. Protective threshold intact to all sites bilateral.   Musculoskeletal: No gross boney pedal deformities bilateral. No pain, crepitus, or limitation noted with foot and ankle range of motion bilateral. Muscular strength 5/5 in all groups tested bilateral.  Gait: Unassisted, Nonantalgic.   No images are attached to the encounter.  Assessment:   1. Pain due to onychomycosis of toenails of both feet   2. Onychomycosis      Plan:  Patient was evaluated and treated and all questions answered.  Onychomycosis -Educated on etiology of nail fungus. -Baseline liver function studies ordered. Will d/c terbinafine if elevated during therapy. -eRx for oral terbinafine 250 mg daily for 3 months #30 with 2 refills. Educated on risks and  benefits of the medication. -E- Rx for Penlac 8% solution apply topically daily to all nails at night.  #Onychomycosis with pain  -Nails palliatively debrided as below. -Educated on self-care -Discussed removal of the bilateral hallux nails avulsion procedure if needed in the future if still having pain with the nails.  For now we debrided them to reduce the thickness.  Procedure: Nail Debridement Rationale: Pain Type of Debridement: manual, sharp debridement. Instrumentation: Nail nipper, rotary burr. Number of Nails: 10  Return in about 3 months (around 12/19/2022) for Follow-up onychomycosis.          Corinna Gab, DPM [facility] / [facility]

## 2022-09-22 LAB — HEPATIC FUNCTION PANEL
ALT: 23 IU/L (ref 0–44)
AST: 30 IU/L (ref 0–40)
Albumin: 4.2 g/dL (ref 3.9–4.9)
Alkaline Phosphatase: 64 IU/L (ref 44–121)
Bilirubin Total: 0.6 mg/dL (ref 0.0–1.2)
Bilirubin, Direct: 0.16 mg/dL (ref 0.00–0.40)
Total Protein: 7 g/dL (ref 6.0–8.5)

## 2022-12-19 ENCOUNTER — Ambulatory Visit: Payer: Medicaid Other | Admitting: Podiatry

## 2023-01-27 ENCOUNTER — Ambulatory Visit (INDEPENDENT_AMBULATORY_CARE_PROVIDER_SITE_OTHER): Payer: Medicaid Other | Admitting: Podiatry

## 2023-01-27 ENCOUNTER — Ambulatory Visit: Payer: Medicaid Other

## 2023-01-27 DIAGNOSIS — R52 Pain, unspecified: Secondary | ICD-10-CM

## 2023-01-27 DIAGNOSIS — M216X2 Other acquired deformities of left foot: Secondary | ICD-10-CM | POA: Diagnosis not present

## 2023-01-27 DIAGNOSIS — G63 Polyneuropathy in diseases classified elsewhere: Secondary | ICD-10-CM

## 2023-01-27 NOTE — Progress Notes (Signed)
  Subjective:  Patient ID: Cory Tabb., male    DOB: [birth year],  MRN: [id number]  Chief Complaint  Patient presents with   Foot Problem    Patient stated he feels as if he's walking on a balled up sock on the left foot, arch, has been a problem for 6 months     62 y.o. male presents with concern for feeling of the left sciatic on the left foot.  He denies pain but says that it just feels strange.  Patient says he is not diabetic but does have a history of borderline diabetes.  Past Medical History:  Diagnosis Date   Hypertension     No Known Allergies  ROS: Negative except as per HPI above  Objective:  General: AAO x3, NAD  Dermatological: With inspection and palpation of the right and left lower extremities there are no open sores, no preulcerative lesions, no rash or signs of infection present. Nails are of normal length thickness and coloration.   Vascular:  Dorsalis Pedis artery and Posterior Tibial artery pedal pulses are 2/4 bilateral.  Capillary fill time < 3 sec to all digits.   Neruologic: Grossly intact via light touch bilateral. Protective threshold intact to all sites bilateral.  Subjective sensation of locking of THE LEFT SHOE  Musculoskeletal: No gross boney pedal deformities bilateral. No pain, crepitus, or limitation noted with foot and ankle range of motion bilateral. Muscular strength 5/5 in all groups tested bilateral.  Gait: Unassisted, Nonantalgic.   No images are attached to the encounter.  Radiographs:  Deferred Assessment:   1. Polyneuropathy associated with underlying disease   2. Pain      Plan:  Patient was evaluated and treated and all questions answered.  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 -Patient has feeling of subjective sensation of walking the ball of sock but he denies burning tingling or pins and needle  type pain -Recommend continued monitoring at this time unfortunately is likely irreversible but recommend he continue to manage blood sugars and keep them controlled -Recommend gel liners for shoes prevent pressure and feelings of irritation and hard soled shoes.  No follow-ups on file.          Corinna Gab, DPM [facility] / [facility]
